# Patient Record
Sex: Male | Born: 1945 | Race: Black or African American | Hispanic: No | Marital: Married | Smoking: Former smoker
Health system: Southern US, Community
[De-identification: ages and names within clinical notes are randomized; demographics above are authoritative.]

## PROBLEM LIST (undated history)

## (undated) DIAGNOSIS — I639 Cerebral infarction, unspecified: Secondary | ICD-10-CM

## (undated) DIAGNOSIS — I10 Essential (primary) hypertension: Secondary | ICD-10-CM

## (undated) DIAGNOSIS — E7800 Pure hypercholesterolemia, unspecified: Secondary | ICD-10-CM

## (undated) DIAGNOSIS — K219 Gastro-esophageal reflux disease without esophagitis: Secondary | ICD-10-CM

## (undated) DIAGNOSIS — M1990 Unspecified osteoarthritis, unspecified site: Secondary | ICD-10-CM

## (undated) DIAGNOSIS — R1310 Dysphagia, unspecified: Secondary | ICD-10-CM

## (undated) HISTORY — PX: CIRCUMCISION: SUR203

---

## 2009-12-04 ENCOUNTER — Emergency Department (HOSPITAL_BASED_OUTPATIENT_CLINIC_OR_DEPARTMENT_OTHER): Admission: EM | Admit: 2009-12-04 | Discharge: 2009-12-04 | Payer: Self-pay | Admitting: Emergency Medicine

## 2009-12-06 ENCOUNTER — Emergency Department (HOSPITAL_BASED_OUTPATIENT_CLINIC_OR_DEPARTMENT_OTHER): Admission: EM | Admit: 2009-12-06 | Discharge: 2009-12-06 | Payer: Self-pay | Admitting: Emergency Medicine

## 2010-08-01 LAB — GLUCOSE, CAPILLARY: Glucose-Capillary: 129 mg/dL — ABNORMAL HIGH (ref 70–99)

## 2011-05-19 ENCOUNTER — Emergency Department (HOSPITAL_BASED_OUTPATIENT_CLINIC_OR_DEPARTMENT_OTHER)
Admission: EM | Admit: 2011-05-19 | Discharge: 2011-05-19 | Disposition: A | Discharge status: Home / Self Care | Payer: Medicare Other | Attending: Emergency Medicine | Admitting: Emergency Medicine

## 2011-05-19 ENCOUNTER — Encounter: Payer: Self-pay | Admitting: *Deleted

## 2011-05-19 DIAGNOSIS — E119 Type 2 diabetes mellitus without complications: Secondary | ICD-10-CM | POA: Insufficient documentation

## 2011-05-19 DIAGNOSIS — J329 Chronic sinusitis, unspecified: Secondary | ICD-10-CM | POA: Insufficient documentation

## 2011-05-19 DIAGNOSIS — I10 Essential (primary) hypertension: Secondary | ICD-10-CM | POA: Insufficient documentation

## 2011-05-19 DIAGNOSIS — J3489 Other specified disorders of nose and nasal sinuses: Secondary | ICD-10-CM | POA: Insufficient documentation

## 2011-05-19 DIAGNOSIS — Z79899 Other long term (current) drug therapy: Secondary | ICD-10-CM | POA: Insufficient documentation

## 2011-05-19 HISTORY — DX: Essential (primary) hypertension: I10

## 2011-05-19 LAB — GLUCOSE, CAPILLARY: Glucose-Capillary: 128 mg/dL — ABNORMAL HIGH (ref 70–99)

## 2011-05-19 MED ORDER — AZITHROMYCIN 250 MG PO TABS
500.0000 mg | ORAL_TABLET | Freq: Once | ORAL | Status: AC
  Administered 2011-05-19: 500 mg via ORAL
  Filled 2011-05-19: qty 2

## 2011-05-19 MED ORDER — IBUPROFEN 800 MG PO TABS
800.0000 mg | ORAL_TABLET | Freq: Once | ORAL | Status: AC
  Administered 2011-05-19: 800 mg via ORAL
  Filled 2011-05-19: qty 1

## 2011-05-19 MED ORDER — OXYMETAZOLINE HCL 0.05 % NA SOLN
1.0000 | Freq: Once | NASAL | Status: AC
  Administered 2011-05-19: 1 via NASAL
  Filled 2011-05-19: qty 15

## 2011-05-19 MED ORDER — AZITHROMYCIN 250 MG PO TABS: 250.0000 mg | ORAL_TABLET | Freq: Every day | ORAL | Status: AC

## 2011-05-19 NOTE — ED Provider Notes (Signed)
History     CSN: [id number]  Arrival date & time 05/19/11  1140   First MD Initiated Contact with Patient 05/19/11 1154      Chief Complaint  Patient presents with  . Nasal Congestion  . Sinusitis    (Consider location/radiation/quality/duration/timing/severity/associated sxs/prior treatment) HPI Patient is a 66 yo M who presents today for evaluation of his typical sinusitis symptoms.  He has forehead an maxillary pressure bilaterally that is an 8/10.  This is better with rest and worse with palpation and bending forward.  Patient has taken ibuprofen without relief.  He tried to go to his regular doctor but was told they could not see him.  They referred him to our facility to be seen instead.  The patient denies cough or other symptoms.  He is normally treated for this 2 times a year by his PCP with claritin and antibiotics.  He has also tried loratidine at home without success.  There are no other associated or modifying factors. Past Medical History  Diagnosis Date  . Diabetes mellitus   . Hypertension     History reviewed. No pertinent past surgical history.  History reviewed. No pertinent family history.  History  Substance Use Topics  . Smoking status: Never Smoker   . Smokeless tobacco: Never Used  . Alcohol Use: No      Review of Systems  Constitutional: Positive for fatigue.  HENT: Positive for congestion, sore throat, sneezing, postnasal drip and sinus pressure.   Eyes: Negative.   Respiratory: Negative.   Cardiovascular: Negative.   Gastrointestinal: Negative.   Genitourinary: Negative.   Musculoskeletal: Negative.   Skin: Negative.   Neurological: Positive for headaches.  Hematological: Negative.   Psychiatric/Behavioral: Negative.   All other systems reviewed and are negative.    Allergies  Review of patient's allergies indicates no known allergies.  Home Medications   Current Outpatient Rx  Name Route Sig Dispense Refill  . GLIPIZIDE 10 MG PO  TABS Oral Take 10 mg by mouth 2 (two) times daily before a meal.      . INSULIN ASPART 100 UNIT/ML [place] SOLN Subcutaneous Inject into the skin 3 (three) times daily before meals.      Marland Kitchen LISINOPRIL 10 MG PO TABS Oral Take 10 mg by mouth daily.      Marland Kitchen METFORMIN HCL 1000 MG PO TABS Oral Take 1,000 mg by mouth 2 (two) times daily with a meal.      . AZITHROMYCIN 250 MG PO TABS Oral Take 1 tablet (250 mg total) by mouth daily. 4 each 0    BP 137/87  Pulse 83  Temp(Src) 98.7 F (37.1 C) (Oral)  Resp 21  SpO2 97%  Physical Exam  Nursing note and vitals reviewed. Constitutional: He is oriented to person, place, and time. He appears well-developed and well-nourished. No distress.  HENT:  Head: Normocephalic and atraumatic.  Nose: Mucosal edema present. Right sinus exhibits maxillary sinus tenderness and frontal sinus tenderness. Left sinus exhibits maxillary sinus tenderness and frontal sinus tenderness.  Mouth/Throat: Uvula is midline and mucous membranes are normal. Posterior oropharyngeal erythema present. No oropharyngeal exudate or posterior oropharyngeal edema.  Eyes: Conjunctivae and EOM are normal. Pupils are equal, round, and reactive to light.  Neck: Normal range of motion.  Cardiovascular: Normal rate, regular rhythm, normal heart sounds and intact distal pulses.  Exam reveals no gallop and no friction rub.   No murmur heard. Pulmonary/Chest: Effort normal and breath sounds normal. No respiratory distress. He  has no wheezes. He has no rales.  Abdominal: Soft. Bowel sounds are normal. He exhibits no distension. There is no tenderness. There is no rebound and no guarding.  Musculoskeletal: Normal range of motion. He exhibits no edema.  Neurological: He is alert and oriented to person, place, and time. No cranial nerve deficit. He exhibits normal muscle tone. Coordination normal.  Skin: Skin is warm and dry. No rash noted.  Psychiatric: He has a normal mood and affect.    ED Course    Procedures (including critical care time)  Labs Reviewed  GLUCOSE, CAPILLARY - Abnormal; Notable for the following:    Glucose-Capillary 128 (*)    All other components within normal limits   No results found.   1. Sinusitis       MDM  Patient was evaluated by myself.  He was hemodynamically stable and presented with symptoms consistent with prior sinus infections.  He was given ibuprofen, his first dose of azithromycin, and Afrin.  His glucose was checked and was only 128.  He was discharged with remainder of 5 day course of antibiotics and Afrin.  He was advised only to use Afrin for 3 days.  Patient can follow-up with his regular doctor as needed.  Patient was discharged in good condition.        Cyndra Numbers, MD 05/19/11 [phone]

## 2011-05-19 NOTE — ED Notes (Signed)
2 day history of sinus pressure facial pain headache nasal congestion [facility] was told they were full today and to come here to be seen. Pt also has a cough states that he thinks from sinus drainage but is productive at times denies fever or chills

## 2011-05-19 NOTE — ED Notes (Signed)
cbg 128 

## 2011-10-18 ENCOUNTER — Inpatient Hospital Stay (HOSPITAL_BASED_OUTPATIENT_CLINIC_OR_DEPARTMENT_OTHER)
Admission: EM | Admit: 2011-10-18 | Discharge: 2011-10-20 | DRG: 066 | Disposition: A | Discharge status: Home / Self Care | Payer: Medicare Other | Source: Ambulatory Visit | Attending: Internal Medicine | Admitting: Internal Medicine

## 2011-10-18 ENCOUNTER — Encounter (HOSPITAL_BASED_OUTPATIENT_CLINIC_OR_DEPARTMENT_OTHER): Payer: Self-pay

## 2011-10-18 ENCOUNTER — Emergency Department (HOSPITAL_BASED_OUTPATIENT_CLINIC_OR_DEPARTMENT_OTHER): Payer: Medicare Other

## 2011-10-18 DIAGNOSIS — K219 Gastro-esophageal reflux disease without esophagitis: Secondary | ICD-10-CM | POA: Diagnosis present

## 2011-10-18 DIAGNOSIS — I639 Cerebral infarction, unspecified: Secondary | ICD-10-CM

## 2011-10-18 DIAGNOSIS — E119 Type 2 diabetes mellitus without complications: Secondary | ICD-10-CM | POA: Diagnosis present

## 2011-10-18 DIAGNOSIS — I10 Essential (primary) hypertension: Secondary | ICD-10-CM | POA: Diagnosis present

## 2011-10-18 DIAGNOSIS — I6340 Cerebral infarction due to embolism of unspecified cerebral artery: Secondary | ICD-10-CM

## 2011-10-18 DIAGNOSIS — Z794 Long term (current) use of insulin: Secondary | ICD-10-CM

## 2011-10-18 DIAGNOSIS — E1165 Type 2 diabetes mellitus with hyperglycemia: Secondary | ICD-10-CM

## 2011-10-18 DIAGNOSIS — IMO0001 Reserved for inherently not codable concepts without codable children: Secondary | ICD-10-CM

## 2011-10-18 DIAGNOSIS — R42 Dizziness and giddiness: Secondary | ICD-10-CM | POA: Diagnosis present

## 2011-10-18 DIAGNOSIS — I6350 Cerebral infarction due to unspecified occlusion or stenosis of unspecified cerebral artery: Principal | ICD-10-CM | POA: Diagnosis present

## 2011-10-18 DIAGNOSIS — D696 Thrombocytopenia, unspecified: Secondary | ICD-10-CM | POA: Diagnosis present

## 2011-10-18 DIAGNOSIS — Z79899 Other long term (current) drug therapy: Secondary | ICD-10-CM

## 2011-10-18 DIAGNOSIS — B372 Candidiasis of skin and nail: Secondary | ICD-10-CM | POA: Diagnosis not present

## 2011-10-18 HISTORY — DX: Gastro-esophageal reflux disease without esophagitis: K21.9

## 2011-10-18 LAB — DIFFERENTIAL
Basophils Absolute: 0 10*3/uL (ref 0.0–0.1)
Basophils Relative: 0 % (ref 0–1)
Eosinophils Absolute: 0.2 10*3/uL (ref 0.0–0.7)
Eosinophils Relative: 2 % (ref 0–5)
Lymphocytes Relative: 25 % (ref 12–46)
Lymphs Abs: 1.8 10*3/uL (ref 0.7–4.0)
Monocytes Absolute: 0.7 10*3/uL (ref 0.1–1.0)
Monocytes Relative: 10 % (ref 3–12)
Neutro Abs: 4.4 10*3/uL (ref 1.7–7.7)
Neutrophils Relative %: 62 % (ref 43–77)

## 2011-10-18 LAB — URINALYSIS, ROUTINE W REFLEX MICROSCOPIC
Bilirubin Urine: NEGATIVE
Glucose, UA: NEGATIVE mg/dL
Hgb urine dipstick: NEGATIVE
Ketones, ur: NEGATIVE mg/dL
Leukocytes, UA: NEGATIVE
Nitrite: NEGATIVE
Protein, ur: 100 mg/dL — AB
Specific Gravity, Urine: 1.02 (ref 1.005–1.030)
Urobilinogen, UA: 1 mg/dL (ref 0.0–1.0)
pH: 6 (ref 5.0–8.0)

## 2011-10-18 LAB — CBC
HCT: 37.2 % — ABNORMAL LOW (ref 39.0–52.0)
Hemoglobin: 13.3 g/dL (ref 13.0–17.0)
MCH: 30.4 pg (ref 26.0–34.0)
MCHC: 35.8 g/dL (ref 30.0–36.0)
MCV: 85.1 fL (ref 78.0–100.0)
Platelets: 148 10*3/uL — ABNORMAL LOW (ref 150–400)
RBC: 4.37 MIL/uL (ref 4.22–5.81)
RDW: 12.8 % (ref 11.5–15.5)
WBC: 7 10*3/uL (ref 4.0–10.5)

## 2011-10-18 LAB — URINE MICROSCOPIC-ADD ON

## 2011-10-18 LAB — BASIC METABOLIC PANEL
BUN: 13 mg/dL (ref 6–23)
CO2: 28 mEq/L (ref 19–32)
Calcium: 9.6 mg/dL (ref 8.4–10.5)
Chloride: 101 mEq/L (ref 96–112)
Creatinine, Ser: 1 mg/dL (ref 0.50–1.35)
GFR calc Af Amer: 88 mL/min — ABNORMAL LOW (ref >90)
GFR calc non Af Amer: 76 mL/min — ABNORMAL LOW (ref >90)
Glucose, Bld: 113 mg/dL — ABNORMAL HIGH (ref 70–99)
Potassium: 3.6 mEq/L (ref 3.5–5.1)
Sodium: 137 mEq/L (ref 135–145)

## 2011-10-18 LAB — GLUCOSE, CAPILLARY: Glucose-Capillary: 85 mg/dL (ref 70–99)

## 2011-10-18 LAB — TROPONIN I: Troponin I: <0.3 ng/mL (ref <0.30)

## 2011-10-18 MED ORDER — MORPHINE SULFATE 2 MG/ML IJ SOLN
2.0000 mg | Freq: Once | INTRAMUSCULAR | Status: AC
  Administered 2011-10-18: 2 mg via INTRAVENOUS
  Filled 2011-10-18: qty 1

## 2011-10-18 MED ORDER — ACETAMINOPHEN 325 MG PO TABS
650.0000 mg | ORAL_TABLET | Freq: Once | ORAL | Status: AC
  Administered 2011-10-18: 650 mg via ORAL
  Filled 2011-10-18: qty 2

## 2011-10-18 MED ORDER — MECLIZINE HCL 25 MG PO TABS
25.0000 mg | ORAL_TABLET | Freq: Once | ORAL | Status: AC
  Administered 2011-10-18: 25 mg via ORAL
  Filled 2011-10-18: qty 1

## 2011-10-18 NOTE — ED Provider Notes (Signed)
History     CSN: [id number]  Arrival date & time 10/18/11  1734   First MD Initiated Contact with Patient 10/18/11 1747      Chief Complaint  Patient presents with  . Hypertension    (Consider location/radiation/quality/duration/timing/severity/associated sxs/prior treatment) HPI Comments: Pt states that he has had bp elevation as high as 167101:pt states that he has intermittent episodes of dizziness where the room spins with change of positron:pt states that he thinks it is related to his pressure being high:pt states that he has been taking his bp medication  Patient is a 66 y.o. male presenting with hypertension. The history is provided by the patient. No language interpreter was used.  Hypertension This is a chronic problem. The current episode started 1 to 4 weeks ago. The problem occurs constantly. The problem has been unchanged. Associated symptoms include vertigo. Pertinent negatives include no chest pain, congestion, diaphoresis, fatigue or fever. Exacerbated by: changing positions. He has tried nothing for the symptoms.    Past Medical History  Diagnosis Date  . Diabetes mellitus   . Hypertension     History reviewed. No pertinent past surgical history.  No family history on file.  History  Substance Use Topics  . Smoking status: Never Smoker   . Smokeless tobacco: Never Used  . Alcohol Use: No      Review of Systems  Constitutional: Negative for fever, diaphoresis and fatigue.  HENT: Negative for congestion.   Respiratory: Negative.   Cardiovascular: Negative for chest pain.  Neurological: Positive for vertigo.    Allergies  Review of patient's allergies indicates no known allergies.  Home Medications   Current Outpatient Rx  Name Route Sig Dispense Refill  . GLIPIZIDE 10 MG PO TABS Oral Take 10 mg by mouth 2 (two) times daily before a meal.      . LISINOPRIL 10 MG PO TABS Oral Take 10 mg by mouth daily.      Marland Kitchen METFORMIN HCL 1000 MG PO TABS Oral  Take 1,000 mg by mouth 2 (two) times daily with a meal.      . OVER THE COUNTER MEDICATION Topical Apply 1 application topically daily as needed. Gold bond Cream. Patient uses this medication for rash on neck.    Marland Kitchen RANITIDINE HCL PO Oral Take 1 tablet by mouth daily. Patient uses this medication for acid reflux.      BP 157/90  Pulse 81  Temp(Src) 98.4 F (36.9 C) (Oral)  Resp 20  Ht 5\' 7"  (1.702 m)  Wt 180 lb (81.647 kg)  BMI 28.19 kg/m2  SpO2 98%  Physical Exam  Nursing note and vitals reviewed. Constitutional: He is oriented to person, place, and time. He appears well-developed and well-nourished.  HENT:  Head: Normocephalic and atraumatic.  Eyes: Conjunctivae and EOM are normal. Pupils are equal, round, and reactive to light.  Neck: Normal range of motion. Neck supple.  Cardiovascular: Normal rate and regular rhythm.   Pulmonary/Chest: Effort normal and breath sounds normal.  Abdominal: Soft. Bowel sounds are normal.  Musculoskeletal: Normal range of motion.  Neurological: He is alert and oriented to person, place, and time. He exhibits normal muscle tone. Coordination normal.       No drift noted:romberg negative:pt ambulates without problem  Skin:       Pt has scaly patches of skin noted to the back of the neck  Psychiatric: He has a normal mood and affect.    ED Course  Procedures (including critical care time)  Labs Reviewed  CBC - Abnormal; Notable for the following:    HCT 37.2 (*)    Platelets 148 (*)    All other components within normal limits  BASIC METABOLIC PANEL - Abnormal; Notable for the following:    Glucose, Bld 113 (*)    GFR calc non Af Amer 76 (*)    GFR calc Af Amer 88 (*)    All other components within normal limits  URINALYSIS, ROUTINE W REFLEX MICROSCOPIC - Abnormal; Notable for the following:    Protein, ur 100 (*)    All other components within normal limits  DIFFERENTIAL  URINE MICROSCOPIC-ADD ON  TROPONIN I    Date: 10/18/2011   Rate: 65  Rhythm: sinus arrhythmia  QRS Axis: normal  Intervals: normal  ST/T Wave abnormalities: normal  Conduction Disutrbances:none  Narrative Interpretation:   Old EKG Reviewed: unchanged   Dg Chest 2 View  10/18/2011  *RADIOLOGY REPORT*  Clinical Data: Hypertension with lightheadedness.  CHEST - 2 VIEW  Comparison: None.  Findings: The lungs are clear without focal infiltrate, edema, pneumothorax or pleural effusion. The cardiopericardial silhouette is within normal limits for size. Imaged bony structures of the thorax are intact. Telemetry leads overlie the chest.  IMPRESSION: No acute cardiopulmonary findings.  Original Report Authenticated By: ERIC A. MANSELL, M.D.   Ct Head Wo Contrast  10/18/2011  *RADIOLOGY REPORT*  Clinical Data: Dizziness with headache.  CT HEAD WITHOUT CONTRAST  Technique:  Contiguous axial images were obtained from the base of the skull through the vertex without contrast.  Comparison: None.  Findings: No evidence for acute hemorrhage, hydrocephalus, mass lesion, or abnormal extra-axial fluid collection.  There is an area of focally decreased attenuation in the anterior aspect of the left basal ganglia which could be an area of acute to subacute ischemia. Patchy low attenuation in the deep hemispheric and periventricular white matter is nonspecific, but likely reflects chronic microvascular ischemic demyelination.  The visualized paranasal sinuses and mastoid air cells are clear.  IMPRESSION: Focal area of asymmetrically decreased attenuation in the left basal ganglia may be an area of acute to subacute ischemia.  This could also be related to asymmetric chronic underlying chronic small vessel microvascular disease as demonstrated elsewhere.  MRI could be used to further evaluate as clinically warranted.  Original Report Authenticated By: ERIC A. MANSELL, M.D.     1. Stroke       MDM  Pt c/o of  Headache since being here:pt states that he has gotten some relief  with meclizine:pt is able to ambulate without assistance 10:30 PM Spoke with Dr. Roseanne Reno with neurology and he will see the pt in consult 10:43 PM Pt is admitted to hospitalist        Teressa Lower, NP 10/18/11 2244  Teressa Lower, NP 10/18/11 2245

## 2011-10-18 NOTE — ED Notes (Signed)
Patient transported to CT 

## 2011-10-18 NOTE — ED Notes (Signed)
[facility] en route to get pt. Pt eating crackers and sipping on water. Vitals stable.

## 2011-10-18 NOTE — ED Notes (Signed)
Did CBG on patient and result was: 71 Nurse notified of blood sugar.

## 2011-10-18 NOTE — ED Notes (Signed)
C/o elevated BP with light headness x 2-3 weeks-taking BP meds-unable to get appt with PCP-also c/o "2 bumps" to posterior neck-denies pain

## 2011-10-19 ENCOUNTER — Inpatient Hospital Stay (HOSPITAL_COMMUNITY): Payer: Medicare Other

## 2011-10-19 ENCOUNTER — Encounter (HOSPITAL_COMMUNITY): Payer: Self-pay | Admitting: *Deleted

## 2011-10-19 DIAGNOSIS — IMO0001 Reserved for inherently not codable concepts without codable children: Secondary | ICD-10-CM

## 2011-10-19 DIAGNOSIS — E1165 Type 2 diabetes mellitus with hyperglycemia: Secondary | ICD-10-CM

## 2011-10-19 DIAGNOSIS — K219 Gastro-esophageal reflux disease without esophagitis: Secondary | ICD-10-CM | POA: Diagnosis present

## 2011-10-19 DIAGNOSIS — E119 Type 2 diabetes mellitus without complications: Secondary | ICD-10-CM | POA: Diagnosis present

## 2011-10-19 DIAGNOSIS — I6340 Cerebral infarction due to embolism of unspecified cerebral artery: Secondary | ICD-10-CM

## 2011-10-19 DIAGNOSIS — I10 Essential (primary) hypertension: Secondary | ICD-10-CM | POA: Diagnosis present

## 2011-10-19 DIAGNOSIS — G459 Transient cerebral ischemic attack, unspecified: Secondary | ICD-10-CM

## 2011-10-19 DIAGNOSIS — I639 Cerebral infarction, unspecified: Secondary | ICD-10-CM

## 2011-10-19 LAB — LIPID PANEL
Cholesterol: 144 mg/dL (ref 0–200)
HDL: 40 mg/dL (ref >39)
LDL Cholesterol: 89 mg/dL (ref 0–99)
Total CHOL/HDL Ratio: 3.6 RATIO
Triglycerides: 76 mg/dL (ref <150)
VLDL: 15 mg/dL (ref 0–40)

## 2011-10-19 LAB — GLUCOSE, CAPILLARY
Glucose-Capillary: 110 mg/dL — ABNORMAL HIGH (ref 70–99)
Glucose-Capillary: 87 mg/dL (ref 70–99)
Glucose-Capillary: 58 mg/dL — ABNORMAL LOW (ref 70–99)
Glucose-Capillary: 176 mg/dL — ABNORMAL HIGH (ref 70–99)
Glucose-Capillary: 118 mg/dL — ABNORMAL HIGH (ref 70–99)
Glucose-Capillary: 104 mg/dL — ABNORMAL HIGH (ref 70–99)

## 2011-10-19 LAB — HEMOGLOBIN A1C
Hgb A1c MFr Bld: 5.9 % — ABNORMAL HIGH (ref <5.7)
Mean Plasma Glucose: 123 mg/dL — ABNORMAL HIGH (ref <117)

## 2011-10-19 MED ORDER — TRAMADOL HCL 50 MG PO TABS
50.0000 mg | ORAL_TABLET | Freq: Two times a day (BID) | ORAL | Status: DC | PRN
  Administered 2011-10-20: 50 mg via ORAL
  Filled 2011-10-19: qty 1

## 2011-10-19 MED ORDER — ASPIRIN 300 MG RE SUPP
300.0000 mg | Freq: Every day | RECTAL | Status: DC
  Filled 2011-10-19: qty 1

## 2011-10-19 MED ORDER — INSULIN GLARGINE 100 UNIT/ML ~~LOC~~ SOLN
10.0000 [IU] | Freq: Every day | SUBCUTANEOUS | Status: DC
  Administered 2011-10-19 (×2): 10 [IU] via SUBCUTANEOUS

## 2011-10-19 MED ORDER — SENNOSIDES-DOCUSATE SODIUM 8.6-50 MG PO TABS: 1.0000 | ORAL_TABLET | Freq: Every evening | ORAL | Status: DC | PRN

## 2011-10-19 MED ORDER — FAMOTIDINE 10 MG PO TABS
10.0000 mg | ORAL_TABLET | Freq: Every day | ORAL | Status: DC
  Administered 2011-10-19 – 2011-10-20 (×2): 10 mg via ORAL
  Filled 2011-10-19 (×2): qty 1

## 2011-10-19 MED ORDER — INSULIN ASPART 100 UNIT/ML ~~LOC~~ SOLN
0.0000 [IU] | Freq: Three times a day (TID) | SUBCUTANEOUS | Status: DC
  Administered 2011-10-20: 2 [IU] via SUBCUTANEOUS

## 2011-10-19 MED ORDER — ASPIRIN 325 MG PO TABS
325.0000 mg | ORAL_TABLET | Freq: Every day | ORAL | Status: DC
  Administered 2011-10-19: 325 mg via ORAL
  Filled 2011-10-19: qty 1

## 2011-10-19 MED ORDER — GLIPIZIDE 10 MG PO TABS
10.0000 mg | ORAL_TABLET | Freq: Two times a day (BID) | ORAL | Status: DC
  Administered 2011-10-19: 10 mg via ORAL
  Filled 2011-10-19 (×3): qty 1

## 2011-10-19 MED ORDER — ONDANSETRON HCL 4 MG/2ML IJ SOLN: 4.0000 mg | Freq: Four times a day (QID) | INTRAMUSCULAR | Status: DC | PRN

## 2011-10-19 MED ORDER — LISINOPRIL 10 MG PO TABS
10.0000 mg | ORAL_TABLET | Freq: Every day | ORAL | Status: DC
  Administered 2011-10-19: 10 mg via ORAL
  Filled 2011-10-19: qty 1

## 2011-10-19 MED ORDER — SIMVASTATIN 20 MG PO TABS
20.0000 mg | ORAL_TABLET | Freq: Every day | ORAL | Status: DC
  Administered 2011-10-19: 20 mg via ORAL
  Filled 2011-10-19 (×2): qty 1

## 2011-10-19 MED ORDER — SODIUM CHLORIDE 0.9 % IV SOLN
INTRAVENOUS | Status: DC
  Administered 2011-10-19 (×3): via INTRAVENOUS

## 2011-10-19 MED ORDER — ASPIRIN EC 81 MG PO TBEC
81.0000 mg | DELAYED_RELEASE_TABLET | Freq: Every day | ORAL | Status: DC
  Administered 2011-10-19 – 2011-10-20 (×2): 81 mg via ORAL
  Filled 2011-10-19 (×2): qty 1

## 2011-10-19 MED ORDER — METFORMIN HCL 500 MG PO TABS
1000.0000 mg | ORAL_TABLET | Freq: Two times a day (BID) | ORAL | Status: DC
  Administered 2011-10-19: 1000 mg via ORAL
  Filled 2011-10-19 (×3): qty 2

## 2011-10-19 MED ORDER — ACETAMINOPHEN 325 MG PO TABS
650.0000 mg | ORAL_TABLET | Freq: Four times a day (QID) | ORAL | Status: DC | PRN
  Administered 2011-10-19 (×2): 650 mg via ORAL
  Filled 2011-10-19 (×2): qty 2

## 2011-10-19 MED ORDER — OXYCODONE HCL 5 MG/5ML PO SOLN
5.0000 mg | Freq: Four times a day (QID) | ORAL | Status: DC | PRN
  Administered 2011-10-19 – 2011-10-20 (×3): 5 mg via ORAL
  Filled 2011-10-19 (×3): qty 5

## 2011-10-19 MED ORDER — ENOXAPARIN SODIUM 40 MG/0.4ML ~~LOC~~ SOLN
40.0000 mg | Freq: Every day | SUBCUTANEOUS | Status: DC
  Administered 2011-10-19 – 2011-10-20 (×2): 40 mg via SUBCUTANEOUS
  Filled 2011-10-19 (×2): qty 0.4

## 2011-10-19 NOTE — Evaluation (Signed)
Speech Language Pathology Evaluation Patient Details Name: Danen Lapaglia MRN: [id number] DOB: 01-Jul-1945 Today's Date: 10/19/2011 Time: 4098-1191 SLP Time Calculation (min): 17 min  Problem List:  Patient Active Problem List  Diagnoses  . Stroke  . DM (diabetes mellitus)  . HTN (hypertension)  . GERD (gastroesophageal reflux disease)   Past Medical History:  Past Medical History  Diagnosis Date  . Diabetes mellitus   . Hypertension   . GERD (gastroesophageal reflux disease)    Past Surgical History: History reviewed. No pertinent past surgical history. HPI:  This 66 year old male with history of diabetes and hypertension who is pretty much healthy working in his yard most of the time presented to the ED complaining of dizziness on and off since Saturday. His blood pressure has been going up and down in the last 3 weeks and he was worried about his symptoms continue with his blood pressure. In the ED he was evaluated and found to have a stroke; left basal ganglia.    Assessment / Plan / Recommendation Clinical Impression  Cognitive-linguistic skills appear WFL at this time. Patient soft spoken and with mildly delayed processing time however highly suspect this is secondary to reported significant lethargy. No f/u SLP needs indicated at this time. Please re-consult as needed.     SLP Assessment  Patient does not need any further Speech Lanaguage Pathology Services    Follow Up Recommendations  None       Pertinent Vitals/Pain n/a   SLP Goals  SLP Goals Progress/Goals/Alternative treatment plan discussed with pt/caregiver and they: Agree  SLP Evaluation Prior Functioning  Cognitive/Linguistic Baseline: Within functional limits Lives With: Spouse   Cognition  Overall Cognitive Status: Appears within functional limits for tasks assessed Orientation Level: Oriented X4    Comprehension  Auditory Comprehension Overall Auditory Comprehension: Appears within functional limits  for tasks assessed Visual Recognition/Discrimination Discrimination: Within Function Limits Reading Comprehension Reading Status: Within funtional limits (poor vision due to cataracts)    Expression Expression Primary Mode of Expression: Verbal Verbal Expression Overall Verbal Expression: Appears within functional limits for tasks assessed Written Expression Written Expression: Not tested   Oral / Motor Oral Motor/Sensory Function Overall Oral Motor/Sensory Function: Appears within functional limits for tasks assessed Motor Speech Overall Motor Speech: Appears within functional limits for tasks assessed    Ferdinand Lango MA, CCC-SLP [phone]  Bryauna Byrum Meryl 10/19/2011, 10:15 AM

## 2011-10-19 NOTE — ED Provider Notes (Signed)
Neuro:  + Romberg, CN 2-12 intact, no abnormal tone or coordination  Medical screening examination/treatment/procedure(s) were conducted as a shared visit with non-physician practitioner(s) and myself.  I personally evaluated the patient during the encounter  Cyndra Numbers, MD 10/19/11 [phone]

## 2011-10-19 NOTE — Progress Notes (Signed)
Aaron Blevins, OTR/L Pager: [phone] 10/19/2011

## 2011-10-19 NOTE — Progress Notes (Signed)
*  PRELIMINARY RESULTS* Echocardiogram 2D Echocardiogram has been performed.  Aaron Blevins 10/19/2011, 10:13 AM

## 2011-10-19 NOTE — Progress Notes (Signed)
VASCULAR LAB PRELIMINARY  PRELIMINARY  PRELIMINARY  PRELIMINARY  Carotid duplex completed.    Preliminary report:  Bilateral:  No evidence of hemodynamically significant internal carotid artery stenosis.   Vertebral artery flow is antegrade.     Liviana Mills D, RVS 10/19/2011, 4:24 PM

## 2011-10-19 NOTE — Consult Note (Signed)
[facility] CONSULT NOTE     Reason for Consult: possible stroke    HPI:   LSN 3 weeks ago--no tPA given - presented beyond time window, and no clear focal deficits.    Aaron Blevins is an 66 y.o. male who has been having "dizzy spells" for over 3 weeks now.  These can occur while sitting or standing.  He believes they become worse when he lays down versus standing up and does not describe any positional component.  When asked about rushing or tinnitus he states he does have a "ringing" in his ear from time to time but is not associated with his "dizzy sensation.  He also endorses some left arm weakness and bilateral leg weakness at times. He has noted his BP has been elevated over the past few weeks, most notably his diastolic BP.  At present time he is back to his baseline but does have a left temporal HA. CT scan in the emergency room showed findings indicating possible subacute basal ganglia stroke. Was no indication of basal ganglia stroke on MRI study. However MRI did show an area of abnormality on diffusion weighted image as well as ADC mapping indicating possible acute small left pontine infarction, versus artifact.  He was given Meclizine while in the hospital and states it helped a little.   Past Medical History  Diagnosis Date  . Diabetes mellitus   . Hypertension   . GERD (gastroesophageal reflux disease)     History reviewed. No pertinent past surgical history.  History reviewed. No pertinent family history.  Social History:  reports that he has never smoked. He has never used smokeless tobacco. He reports that he does not drink alcohol or use illicit drugs.  No Known Allergies  Medications:    Prior to Admission:  Prescriptions prior to admission  Medication Sig Dispense Refill  . glipiZIDE (GLUCOTROL) 10 MG tablet Take 10 mg by mouth 2 (two) times daily before a meal.        . insulin glargine (LANTUS) 100 UNIT/ML injection Inject 10  Units into the skin at bedtime.      Marland Kitchen lisinopril (PRINIVIL,ZESTRIL) 10 MG tablet Take 10 mg by mouth daily.        . metFORMIN (GLUCOPHAGE) 1000 MG tablet Take 1,000 mg by mouth 2 (two) times daily with a meal.        . OVER THE COUNTER MEDICATION Apply 1 application topically daily as needed. Gold bond Cream. Patient uses this medication for rash on neck.      Marland Kitchen RANITIDINE HCL PO Take 1 tablet by mouth daily. Patient uses this medication for acid reflux.       Scheduled:   . acetaminophen  650 mg Oral Once  . aspirin  300 mg Rectal Daily   Or  . aspirin  325 mg Oral Daily  . enoxaparin  40 mg Subcutaneous Daily  . famotidine  10 mg Oral Daily  . glipiZIDE  10 mg Oral BID AC  . insulin aspart  0-9 Units Subcutaneous TID WC  . insulin glargine  10 Units Subcutaneous QHS  . lisinopril  10 mg Oral Daily  . meclizine  25 mg Oral Once  . metFORMIN  1,000 mg Oral BID WC  .  morphine injection  2 mg Intravenous Once  . simvastatin  20 mg Oral q1800  Review of Systems - General ROS: negative for - chills, fatigue, fever or hot flashes Hematological and Lymphatic ROS: negative for - bruising, fatigue, jaundice or pallor Endocrine ROS: negative for - hair pattern changes, hot flashes, mood swings or skin changes Respiratory ROS: negative for - cough, hemoptysis, orthopnea or wheezing Cardiovascular ROS: negative for - dyspnea on exertion, orthopnea, palpitations or shortness of breath Gastrointestinal ROS: negative for - abdominal pain, appetite loss, blood in stools, diarrhea or hematemesis Musculoskeletal ROS: positive for - joint pain, joint stiffness, joint swelling or muscle pain Neurological ROS: positive for - dizziness, peripheral neuropathy and headaches Dermatological ROS: positive for dry skin, rash   Blood pressure 179/80, pulse 77, temperature 98.1 F (36.7 C), temperature source Oral, resp. rate 18, height 5\' 6"  (1.676 m), weight 81.7 kg (180 lb 1.9 oz), SpO2  98.00%.   Neurologic Examination:   Mental Status: Alert, oriented, thought content appropriate.  Speech fluent without evidence of aphasia. Able to follow 3 step commands without difficulty. Cranial Nerves: II-Visual fields grossly intact. III/IV/VI-Extraocular movements intact.  Pupils reactive bilaterally. V/VII-Smile symmetric VIII-grossly intact IX/X-normal gag XI-bilateral shoulder shrug XII-midline tongue extension Motor: 5/5 bilaterally with normal tone and bulk Sensory: Pinprick and light touch intact but vibratory sensation decreased in ankles and feet.   Deep Tendon Reflexes: Depressed throughout Plantars downgoing bilaterally Cerebellar: Normal finger-to-nose, normal rapid alternating movements and normal heel-to-shin test.     Lab Results  Component Value Date/Time   CHOL 144 10/19/2011  7:25 AM    Results for orders placed during the hospital encounter of 10/18/11 (from the past 48 hour(s))  CBC     Status: Abnormal   Collection Time   10/18/11  6:16 PM      Component Value Range Comment   WBC 7.0  4.0 - 10.5 (K/uL)    RBC 4.37  4.22 - 5.81 (MIL/uL)    Hemoglobin 13.3  13.0 - 17.0 (g/dL)    HCT 45.4 (*) 09.8 - 52.0 (%)    MCV 85.1  78.0 - 100.0 (fL)    MCH 30.4  26.0 - 34.0 (pg)    MCHC 35.8  30.0 - 36.0 (g/dL)    RDW 11.9  14.7 - 82.9 (%)    Platelets 148 (*) 150 - 400 (K/uL)   DIFFERENTIAL     Status: Normal   Collection Time   10/18/11  6:16 PM      Component Value Range Comment   Neutrophils Relative 62  43 - 77 (%)    Neutro Abs 4.4  1.7 - 7.7 (K/uL)    Lymphocytes Relative 25  12 - 46 (%)    Lymphs Abs 1.8  0.7 - 4.0 (K/uL)    Monocytes Relative 10  3 - 12 (%)    Monocytes Absolute 0.7  0.1 - 1.0 (K/uL)    Eosinophils Relative 2  0 - 5 (%)    Eosinophils Absolute 0.2  0.0 - 0.7 (K/uL)    Basophils Relative 0  0 - 1 (%)    Basophils Absolute 0.0  0.0 - 0.1 (K/uL)   BASIC METABOLIC PANEL     Status: Abnormal   Collection Time   10/18/11  6:16 PM       Component Value Range Comment   Sodium 137  135 - 145 (mEq/L)    Potassium 3.6  3.5 - 5.1 (mEq/L)    Chloride 101  96 - 112 (mEq/L)    CO2 28  19 - 32 (mEq/L)  Glucose, Bld 113 (*) 70 - 99 (mg/dL)    BUN 13  6 - 23 (mg/dL)    Creatinine, Ser 1.61  0.50 - 1.35 (mg/dL)    Calcium 9.6  8.4 - 10.5 (mg/dL)    GFR calc non Af Amer 76 (*) >90 (mL/min)    GFR calc Af Amer 88 (*) >90 (mL/min)   URINALYSIS, ROUTINE W REFLEX MICROSCOPIC     Status: Abnormal   Collection Time   10/18/11  6:16 PM      Component Value Range Comment   Color, Urine YELLOW  YELLOW     APPearance CLEAR  CLEAR     Specific Gravity, Urine 1.020  1.005 - 1.030     pH 6.0  5.0 - 8.0     Glucose, UA NEGATIVE  NEGATIVE (mg/dL)    Hgb urine dipstick NEGATIVE  NEGATIVE     Bilirubin Urine NEGATIVE  NEGATIVE     Ketones, ur NEGATIVE  NEGATIVE (mg/dL)    Protein, ur 096 (*) NEGATIVE (mg/dL)    Urobilinogen, UA 1.0  0.0 - 1.0 (mg/dL)    Nitrite NEGATIVE  NEGATIVE     Leukocytes, UA NEGATIVE  NEGATIVE    URINE MICROSCOPIC-ADD ON     Status: Normal   Collection Time   10/18/11  6:16 PM      Component Value Range Comment   Squamous Epithelial / LPF RARE  RARE     Bacteria, UA RARE  RARE    TROPONIN I     Status: Normal   Collection Time   10/18/11 10:03 PM      Component Value Range Comment   Troponin I <0.30  <0.30 (ng/mL)   GLUCOSE, CAPILLARY     Status: Normal   Collection Time   10/18/11 11:42 PM      Component Value Range Comment   Glucose-Capillary 85  70 - 99 (mg/dL)    Comment 1 Notify RN     GLUCOSE, CAPILLARY     Status: Abnormal   Collection Time   10/19/11  1:06 AM      Component Value Range Comment   Glucose-Capillary 110 (*) 70 - 99 (mg/dL)    Comment 1 Documented in Chart      Comment 2 Notify RN     GLUCOSE, CAPILLARY     Status: Normal   Collection Time   10/19/11  7:12 AM      Component Value Range Comment   Glucose-Capillary 87  70 - 99 (mg/dL)    Comment 1 Documented in Chart      Comment 2 Notify  RN     LIPID PANEL     Status: Normal   Collection Time   10/19/11  7:25 AM      Component Value Range Comment   Cholesterol 144  0 - 200 (mg/dL)    Triglycerides 76  <045 (mg/dL)    HDL 40  >40 (mg/dL)    Total CHOL/HDL Ratio 3.6      VLDL 15  0 - 40 (mg/dL)    LDL Cholesterol 89  0 - 99 (mg/dL)     Dg Chest 2 View  01/22/1190  *RADIOLOGY REPORT*  Clinical Data: Hypertension with lightheadedness.  CHEST - 2 VIEW  Comparison: None.  Findings: The lungs are clear without focal infiltrate, edema, pneumothorax or pleural effusion. The cardiopericardial silhouette is within normal limits for size. Imaged bony structures of the thorax are intact. Telemetry leads overlie the chest.  IMPRESSION: No acute cardiopulmonary findings.  Original Report Authenticated By: ERIC A. MANSELL, M.D.   Ct Head Wo Contrast  10/18/2011  *RADIOLOGY REPORT*  Clinical Data: Dizziness with headache.  CT HEAD WITHOUT CONTRAST  Technique:  Contiguous axial images were obtained from the base of the skull through the vertex without contrast.  Comparison: None.  Findings: No evidence for acute hemorrhage, hydrocephalus, mass lesion, or abnormal extra-axial fluid collection.  There is an area of focally decreased attenuation in the anterior aspect of the left basal ganglia which could be an area of acute to subacute ischemia. Patchy low attenuation in the deep hemispheric and periventricular white matter is nonspecific, but likely reflects chronic microvascular ischemic demyelination.  The visualized paranasal sinuses and mastoid air cells are clear.  IMPRESSION: Focal area of asymmetrically decreased attenuation in the left basal ganglia may be an area of acute to subacute ischemia.  This could also be related to asymmetric chronic underlying chronic small vessel microvascular disease as demonstrated elsewhere.  MRI could be used to further evaluate as clinically warranted.  Original Report Authenticated By: ERIC A. MANSELL, M.D.    MRI HEAD WITHOUT CONTRAST  MRA HEAD WITHOUT CONTRAST  Technique: Multiplanar, multiecho pulse sequences of the brain and  surrounding structures were obtained according to standard protocol  without intravenous contrast. Angiographic images of the head were  obtained using MRA technique without contrast.  Comparison: 10/18/2011 CT. No comparison MR.  MRI HEAD  Findings: Questionable tiny acute left pontine infarct (series 5  image 9) versus artifact. No other evidence of acute infarct  noted.  Moderate white matter type changes most consistent with result of  small vessel disease.  No intracranial hemorrhage.  No intracranial mass lesion detected on this unenhanced exam.  No hydrocephalus.  Mild paranasal sinus mucosal thickening.  IMPRESSION:  Questionable tiny acute left pontine infarct (series 5 image 9)  versus artifact. No other evidence of acute infarct noted.  Moderate white matter type changes most consistent with result of  small vessel disease.  Mild paranasal sinus mucosal thickening.  MRA HEAD  Findings: Anterior circulation without medium or large size vessel  significant stenosis or occlusion.  No significant stenosis of the vertebral arteries or basilar  artery.  Nonvisualization AICAs.  Mild branch vessel irregularity.  No aneurysm or vascular malformation noted.  IMPRESSION:  Mild branch vessel irregularity. Please see above.   Assessment/Plan:   66 YO male with 3 week history of feeling off balance, dizzy and room spinning from right to left intermittently. 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 It is conceivable that the patient could experienced a pontine stroke as etiology for his symptoms of disequilibrium.  He has significant risk factors for small vessel stroke.   Recommend: 1) Continue ASA daily, 2 D echo and Carotid doppler , as planned.  2) Cardiac Telemetry while in hospital 3) PT for vestibular rehab; may continue meclizine as  well.  4) Continued monitoring and control of hypertension  5) Diabetes management for optimal control.  HbA1C in process.   Stroke team to follow in AM.   Felicie Morn PA-C  [facility] [phone]  10/19/2011, 11:05 AM

## 2011-10-19 NOTE — H&P (Signed)
Aaron Blevins is an 66 y.o. male.   Chief Complaint: Dizziness HPI: This 66 year old male with history of diabetes and hypertension who is pretty much healthy working in his yard most of the time presented to the ED complaining of dizziness on and off since Saturday. His blood pressure has been going up and down in the last 3 weeks and he was worried about his symptoms continue with his blood pressure. In the ED he was evaluated and found to have a stroke. Patient has been having mainly dizziness but did not fall down. He wasn't using a walker but family noticed some point that he was almost going to fall down. He still feels dizzy especially when he gets up. Denies any headaches, focal weakness, no nausea vomiting or diarrhea, no recent or past history of strokes, has family history of strokes one of his daughters had TIAs and patient has risk factors for strokes including diabetes and hypertension.  Past Medical History  Diagnosis Date  . Diabetes mellitus   . Hypertension   . GERD (gastroesophageal reflux disease)     History reviewed. No pertinent past surgical history.  History reviewed. No pertinent family history. Social History:  reports that he has never smoked. He has never used smokeless tobacco. He reports that he does not drink alcohol or use illicit drugs.  Allergies: No Known Allergies  Medications Prior to Admission  Medication Sig Dispense Refill  . glipiZIDE (GLUCOTROL) 10 MG tablet Take 10 mg by mouth 2 (two) times daily before a meal.        . insulin glargine (LANTUS) 100 UNIT/ML injection Inject 10 Units into the skin at bedtime.      Marland Kitchen lisinopril (PRINIVIL,ZESTRIL) 10 MG tablet Take 10 mg by mouth daily.        . metFORMIN (GLUCOPHAGE) 1000 MG tablet Take 1,000 mg by mouth 2 (two) times daily with a meal.        . OVER THE COUNTER MEDICATION Apply 1 application topically daily as needed. Gold bond Cream. Patient uses this medication for rash on neck.      Marland Kitchen RANITIDINE  HCL PO Take 1 tablet by mouth daily. Patient uses this medication for acid reflux.        Results for orders placed during the hospital encounter of 10/18/11 (from the past 48 hour(s))  CBC     Status: Abnormal   Collection Time   10/18/11  6:16 PM      Component Value Range Comment   WBC 7.0  4.0 - 10.5 (K/uL)    RBC 4.37  4.22 - 5.81 (MIL/uL)    Hemoglobin 13.3  13.0 - 17.0 (g/dL)    HCT 16.1 (*) 09.6 - 52.0 (%)    MCV 85.1  78.0 - 100.0 (fL)    MCH 30.4  26.0 - 34.0 (pg)    MCHC 35.8  30.0 - 36.0 (g/dL)    RDW 04.5  40.9 - 81.1 (%)    Platelets 148 (*) 150 - 400 (K/uL)   DIFFERENTIAL     Status: Normal   Collection Time   10/18/11  6:16 PM      Component Value Range Comment   Neutrophils Relative 62  43 - 77 (%)    Neutro Abs 4.4  1.7 - 7.7 (K/uL)    Lymphocytes Relative 25  12 - 46 (%)    Lymphs Abs 1.8  0.7 - 4.0 (K/uL)    Monocytes Relative 10  3 - 12 (%)  Monocytes Absolute 0.7  0.1 - 1.0 (K/uL)    Eosinophils Relative 2  0 - 5 (%)    Eosinophils Absolute 0.2  0.0 - 0.7 (K/uL)    Basophils Relative 0  0 - 1 (%)    Basophils Absolute 0.0  0.0 - 0.1 (K/uL)   BASIC METABOLIC PANEL     Status: Abnormal   Collection Time   10/18/11  6:16 PM      Component Value Range Comment   Sodium 137  135 - 145 (mEq/L)    Potassium 3.6  3.5 - 5.1 (mEq/L)    Chloride 101  96 - 112 (mEq/L)    CO2 28  19 - 32 (mEq/L)    Glucose, Bld 113 (*) 70 - 99 (mg/dL)    BUN 13  6 - 23 (mg/dL)    Creatinine, Ser 1.61  0.50 - 1.35 (mg/dL)    Calcium 9.6  8.4 - 10.5 (mg/dL)    GFR calc non Af Amer 76 (*) >90 (mL/min)    GFR calc Af Amer 88 (*) >90 (mL/min)   URINALYSIS, ROUTINE W REFLEX MICROSCOPIC     Status: Abnormal   Collection Time   10/18/11  6:16 PM      Component Value Range Comment   Color, Urine YELLOW  YELLOW     APPearance CLEAR  CLEAR     Specific Gravity, Urine 1.020  1.005 - 1.030     pH 6.0  5.0 - 8.0     Glucose, UA NEGATIVE  NEGATIVE (mg/dL)    Hgb urine dipstick NEGATIVE   NEGATIVE     Bilirubin Urine NEGATIVE  NEGATIVE     Ketones, ur NEGATIVE  NEGATIVE (mg/dL)    Protein, ur 096 (*) NEGATIVE (mg/dL)    Urobilinogen, UA 1.0  0.0 - 1.0 (mg/dL)    Nitrite NEGATIVE  NEGATIVE     Leukocytes, UA NEGATIVE  NEGATIVE    URINE MICROSCOPIC-ADD ON     Status: Normal   Collection Time   10/18/11  6:16 PM      Component Value Range Comment   Squamous Epithelial / LPF RARE  RARE     Bacteria, UA RARE  RARE    TROPONIN I     Status: Normal   Collection Time   10/18/11 10:03 PM      Component Value Range Comment   Troponin I <0.30  <0.30 (ng/mL)   GLUCOSE, CAPILLARY     Status: Normal   Collection Time   10/18/11 11:42 PM      Component Value Range Comment   Glucose-Capillary 85  70 - 99 (mg/dL)    Comment 1 Notify RN     GLUCOSE, CAPILLARY     Status: Abnormal   Collection Time   10/19/11  1:06 AM      Component Value Range Comment   Glucose-Capillary 110 (*) 70 - 99 (mg/dL)    Comment 1 Documented in Chart      Comment 2 Notify RN      Dg Chest 2 View  10/18/2011  *RADIOLOGY REPORT*  Clinical Data: Hypertension with lightheadedness.  CHEST - 2 VIEW  Comparison: None.  Findings: The lungs are clear without focal infiltrate, edema, pneumothorax or pleural effusion. The cardiopericardial silhouette is within normal limits for size. Imaged bony structures of the thorax are intact. Telemetry leads overlie the chest.  IMPRESSION: No acute cardiopulmonary findings.  Original Report Authenticated By: ERIC A. MANSELL, M.D.   Ct Head  Wo Contrast  10/18/2011  *RADIOLOGY REPORT*  Clinical Data: Dizziness with headache.  CT HEAD WITHOUT CONTRAST  Technique:  Contiguous axial images were obtained from the base of the skull through the vertex without contrast.  Comparison: None.  Findings: No evidence for acute hemorrhage, hydrocephalus, mass lesion, or abnormal extra-axial fluid collection.  There is an area of focally decreased attenuation in the anterior aspect of the left basal  ganglia which could be an area of acute to subacute ischemia. Patchy low attenuation in the deep hemispheric and periventricular white matter is nonspecific, but likely reflects chronic microvascular ischemic demyelination.  The visualized paranasal sinuses and mastoid air cells are clear.  IMPRESSION: Focal area of asymmetrically decreased attenuation in the left basal ganglia may be an area of acute to subacute ischemia.  This could also be related to asymmetric chronic underlying chronic small vessel microvascular disease as demonstrated elsewhere.  MRI could be used to further evaluate as clinically warranted.  Original Report Authenticated By: ERIC A. MANSELL, M.D.    Review of Systems  Constitutional: Negative.   HENT: Negative.   Eyes: Negative.   Respiratory: Negative.   Cardiovascular: Negative.   Genitourinary: Negative.   Musculoskeletal: Negative.   Skin: Negative.   Neurological: Positive for dizziness. Negative for tingling, sensory change and speech change.  Psychiatric/Behavioral: Negative.   All other systems reviewed and are negative.    Blood pressure 162/87, pulse 64, temperature 98.7 F (37.1 C), temperature source Oral, resp. rate 16, height 5\' 6"  (1.676 m), weight 81.7 kg (180 lb 1.9 oz), SpO2 99.00%. Physical Exam  Constitutional: He is oriented to person, place, and time. He appears well-developed and well-nourished.  HENT:  Head: Normocephalic and atraumatic.  Right Ear: External ear normal.  Left Ear: External ear normal.  Nose: Nose normal.  Mouth/Throat: Oropharynx is clear and moist.  Eyes: Conjunctivae and EOM are normal. Pupils are equal, round, and reactive to light.  Neck: Normal range of motion. Neck supple.  Cardiovascular: Normal rate, regular rhythm, normal heart sounds and intact distal pulses.   Respiratory: Effort normal and breath sounds normal.  GI: Soft. Bowel sounds are normal.  Musculoskeletal: Normal range of motion.  Neurological: He  is alert and oriented to person, place, and time. He has normal reflexes. Coordination abnormal.  Skin: Skin is warm and dry.  Psychiatric: He has a normal mood and affect. His behavior is normal. Judgment and thought content normal.     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 Also diabetic hypertensive with some thrombocytopenia. Plan #1 stroke: Patient will be admitted for workup of acute stroke. We will check MRI/MRA of the brain, Carotid Doppler, We'll also check echocardiogram lipid panel. Patient will be started on aspirin as well as statins. Will get neurology consult in the morning for further follow up. His blood pressure in the 160s to 180s. Also get PT & OT to evaluate the patient and make recommendations #2 diabetes: We'll continue his home medications as well as sliding scale insulin. We'll check hemoglobin A1c. Check CBGs a.c. and each bedtime and adjust his medications as necessary #3 hypertension: In the setting of acute stroke we will repeat his blood pressure on the high side. #4 GERD: Continue with his ranitidine and consider adding PPIs if he has symptoms. #5 prophylaxis: We'll keep him on DVT prophylaxis using Lovenox.  Shaiann Mcmanamon,LAWAL 10/19/2011, 1:39 AM

## 2011-10-19 NOTE — Progress Notes (Addendum)
PT/OT Cancellation Note  PT/OT orders received and appreciated. Noted orders to start 6/5. Will defer eval till tomorrow.   [facility] [name] 10/19/2011, 11:02 AM Pager: [phone]

## 2011-10-19 NOTE — Progress Notes (Signed)
Patient's CBG checked at 1221 was 58, hypoglycemic protocol has been initiated , MD notified and waiting to check CBG again.

## 2011-10-19 NOTE — Progress Notes (Signed)
Subjective: Patient denies any new complaints.   Objective: Weight change:   Intake/Output Summary (Last 24 hours) at 10/19/11 1543 Last data filed at 10/19/11 0700  Gross per 24 hour  Intake 821.67 ml  Output    800 ml  Net  21.67 ml    Filed Vitals:   10/19/11 1427  BP: 117/71  Pulse: 66  Temp: 98.6 F (37 C)  Resp: 18   Physical Exam  He is alert, afebrile comfortable CVS S1S2 RRR, no murmers rubs or gallops Lungs CTAB, No wheezing, rhonchi or rales Abdomen : soft NT ND BS+ Extremities; no pedal edema.  Neuro: no motor or sensory deficits. Pt is alert and oriented to person and place, time.   Lab Results: Results for orders placed during the hospital encounter of 10/18/11 (from the past 24 hour(s))  CBC     Status: Abnormal   Collection Time   10/18/11  6:16 PM      Component Value Range   WBC 7.0  4.0 - 10.5 (K/uL)   RBC 4.37  4.22 - 5.81 (MIL/uL)   Hemoglobin 13.3  13.0 - 17.0 (g/dL)   HCT 16.1 (*) 09.6 - 52.0 (%)   MCV 85.1  78.0 - 100.0 (fL)   MCH 30.4  26.0 - 34.0 (pg)   MCHC 35.8  30.0 - 36.0 (g/dL)   RDW 04.5  40.9 - 81.1 (%)   Platelets 148 (*) 150 - 400 (K/uL)  DIFFERENTIAL     Status: Normal   Collection Time   10/18/11  6:16 PM      Component Value Range   Neutrophils Relative 62  43 - 77 (%)   Neutro Abs 4.4  1.7 - 7.7 (K/uL)   Lymphocytes Relative 25  12 - 46 (%)   Lymphs Abs 1.8  0.7 - 4.0 (K/uL)   Monocytes Relative 10  3 - 12 (%)   Monocytes Absolute 0.7  0.1 - 1.0 (K/uL)   Eosinophils Relative 2  0 - 5 (%)   Eosinophils Absolute 0.2  0.0 - 0.7 (K/uL)   Basophils Relative 0  0 - 1 (%)   Basophils Absolute 0.0  0.0 - 0.1 (K/uL)  BASIC METABOLIC PANEL     Status: Abnormal   Collection Time   10/18/11  6:16 PM      Component Value Range   Sodium 137  135 - 145 (mEq/L)   Potassium 3.6  3.5 - 5.1 (mEq/L)   Chloride 101  96 - 112 (mEq/L)   CO2 28  19 - 32 (mEq/L)   Glucose, Bld 113 (*) 70 - 99 (mg/dL)   BUN 13  6 - 23 (mg/dL)   Creatinine,  Ser 9.14  0.50 - 1.35 (mg/dL)   Calcium 9.6  8.4 - 78.2 (mg/dL)   GFR calc non Af Amer 76 (*) >90 (mL/min)   GFR calc Af Amer 88 (*) >90 (mL/min)  URINALYSIS, ROUTINE W REFLEX MICROSCOPIC     Status: Abnormal   Collection Time   10/18/11  6:16 PM      Component Value Range   Color, Urine YELLOW  YELLOW    APPearance CLEAR  CLEAR    Specific Gravity, Urine 1.020  1.005 - 1.030    pH 6.0  5.0 - 8.0    Glucose, UA NEGATIVE  NEGATIVE (mg/dL)   Hgb urine dipstick NEGATIVE  NEGATIVE    Bilirubin Urine NEGATIVE  NEGATIVE    Ketones, ur NEGATIVE  NEGATIVE (mg/dL)  Protein, ur 100 (*) NEGATIVE (mg/dL)   Urobilinogen, UA 1.0  0.0 - 1.0 (mg/dL)   Nitrite NEGATIVE  NEGATIVE    Leukocytes, UA NEGATIVE  NEGATIVE   URINE MICROSCOPIC-ADD ON     Status: Normal   Collection Time   10/18/11  6:16 PM      Component Value Range   Squamous Epithelial / LPF RARE  RARE    Bacteria, UA RARE  RARE   TROPONIN I     Status: Normal   Collection Time   10/18/11 10:03 PM      Component Value Range   Troponin I <0.30  <0.30 (ng/mL)  GLUCOSE, CAPILLARY     Status: Normal   Collection Time   10/18/11 11:42 PM      Component Value Range   Glucose-Capillary 85  70 - 99 (mg/dL)   Comment 1 Notify RN    GLUCOSE, CAPILLARY     Status: Abnormal   Collection Time   10/19/11  1:06 AM      Component Value Range   Glucose-Capillary 110 (*) 70 - 99 (mg/dL)   Comment 1 Documented in Chart     Comment 2 Notify RN    GLUCOSE, CAPILLARY     Status: Normal   Collection Time   10/19/11  7:12 AM      Component Value Range   Glucose-Capillary 87  70 - 99 (mg/dL)   Comment 1 Documented in Chart     Comment 2 Notify RN    HEMOGLOBIN A1C     Status: Abnormal   Collection Time   10/19/11  7:25 AM      Component Value Range   Hemoglobin A1C 5.9 (*) <5.7 (%)   Mean Plasma Glucose 123 (*) <117 (mg/dL)  LIPID PANEL     Status: Normal   Collection Time   10/19/11  7:25 AM      Component Value Range   Cholesterol 144  0 - 200  (mg/dL)   Triglycerides 76  <409 (mg/dL)   HDL 40  >81 (mg/dL)   Total CHOL/HDL Ratio 3.6     VLDL 15  0 - 40 (mg/dL)   LDL Cholesterol 89  0 - 99 (mg/dL)  GLUCOSE, CAPILLARY     Status: Abnormal   Collection Time   10/19/11 12:21 PM      Component Value Range   Glucose-Capillary 58 (*) 70 - 99 (mg/dL)  GLUCOSE, CAPILLARY     Status: Abnormal   Collection Time   10/19/11  1:24 PM      Component Value Range   Glucose-Capillary 176 (*) 70 - 99 (mg/dL)     Micro Results: No results found for this or any previous visit (from the past 240 hour(s)).  Studies/Results: Dg Chest 2 View  10/19/2011  *RADIOLOGY REPORT*  Clinical Data: Stroke  CHEST - 2 VIEW  Comparison: 10/18/2011  Findings: The heart size and mediastinal contours are within normal limits.  Both lungs are clear.  The visualized skeletal structures are unremarkable.  IMPRESSION: Negative examination.  Original Report Authenticated By: Rosealee Albee, M.D.   Dg Chest 2 View  10/18/2011  *RADIOLOGY REPORT*  Clinical Data: Hypertension with lightheadedness.  CHEST - 2 VIEW  Comparison: None.  Findings: The lungs are clear without focal infiltrate, edema, pneumothorax or pleural effusion. The cardiopericardial silhouette is within normal limits for size. Imaged bony structures of the thorax are intact. Telemetry leads overlie the chest.  IMPRESSION: No acute cardiopulmonary findings.  Original Report Authenticated By: ERIC A. MANSELL, M.D.   Ct Head Wo Contrast  10/18/2011  *RADIOLOGY REPORT*  Clinical Data: Dizziness with headache.  CT HEAD WITHOUT CONTRAST  Technique:  Contiguous axial images were obtained from the base of the skull through the vertex without contrast.  Comparison: None.  Findings: No evidence for acute hemorrhage, hydrocephalus, mass lesion, or abnormal extra-axial fluid collection.  There is an area of focally decreased attenuation in the anterior aspect of the left basal ganglia which could be an area of acute to  subacute ischemia. Patchy low attenuation in the deep hemispheric and periventricular white matter is nonspecific, but likely reflects chronic microvascular ischemic demyelination.  The visualized paranasal sinuses and mastoid air cells are clear.  IMPRESSION: Focal area of asymmetrically decreased attenuation in the left basal ganglia may be an area of acute to subacute ischemia.  This could also be related to asymmetric chronic underlying chronic small vessel microvascular disease as demonstrated elsewhere.  MRI could be used to further evaluate as clinically warranted.  Original Report Authenticated By: ERIC A. MANSELL, M.D.   Mr Brain Wo Contrast  10/19/2011  *RADIOLOGY REPORT*  Clinical Data:  .  Dizziness for the past 4 days.  Diabetic hypertensive.  MRI HEAD WITHOUT CONTRAST MRA HEAD WITHOUT CONTRAST  Technique: Multiplanar, multiecho pulse sequences of the brain and surrounding structures were obtained according to standard protocol without intravenous contrast.  Angiographic images of the head were obtained using MRA technique without contrast.  Comparison: 10/18/2011 CT.  No comparison MR.  MRI HEAD  Findings:  Questionable tiny acute left pontine infarct (series 5 image 9) versus artifact.  No other evidence of acute infarct noted.  Moderate white matter type changes most consistent with result of small vessel disease.  No intracranial hemorrhage.  No intracranial mass lesion detected on this unenhanced exam.  No hydrocephalus.  Mild paranasal sinus mucosal thickening.  IMPRESSION: Questionable tiny acute left pontine infarct (series 5 image 9) versus artifact.  No other evidence of acute infarct noted.  Moderate white matter type changes most consistent with result of small vessel disease.  Mild paranasal sinus mucosal thickening.  MRA HEAD  Findings: Anterior circulation without medium or large size vessel significant stenosis or occlusion.  No significant stenosis of the vertebral arteries or basilar  artery.  Nonvisualization AICAs.  Mild branch vessel irregularity.  No aneurysm or vascular malformation noted.  IMPRESSION:  Mild branch vessel irregularity.  Please see above.  Original Report Authenticated By: Fuller Canada, M.D.   Mr Mra Head/brain Wo Cm  10/19/2011  *RADIOLOGY REPORT*  Clinical Data:  .  Dizziness for the past 4 days.  Diabetic hypertensive.  MRI HEAD WITHOUT CONTRAST MRA HEAD WITHOUT CONTRAST  Technique: Multiplanar, multiecho pulse sequences of the brain and surrounding structures were obtained according to standard protocol without intravenous contrast.  Angiographic images of the head were obtained using MRA technique without contrast.  Comparison: 10/18/2011 CT.  No comparison MR.  MRI HEAD  Findings:  Questionable tiny acute left pontine infarct (series 5 image 9) versus artifact.  No other evidence of acute infarct noted.  Moderate white matter type changes most consistent with result of small vessel disease.  No intracranial hemorrhage.  No intracranial mass lesion detected on this unenhanced exam.  No hydrocephalus.  Mild paranasal sinus mucosal thickening.  IMPRESSION: Questionable tiny acute left pontine infarct (series 5 image 9) versus artifact.  No other evidence of acute infarct noted.  Moderate white matter  type changes most consistent with result of small vessel disease.  Mild paranasal sinus mucosal thickening.  MRA HEAD  Findings: Anterior circulation without medium or large size vessel significant stenosis or occlusion.  No significant stenosis of the vertebral arteries or basilar artery.  Nonvisualization AICAs.  Mild branch vessel irregularity.  No aneurysm or vascular malformation noted.  IMPRESSION:  Mild branch vessel irregularity.  Please see above.  Original Report Authenticated By: Fuller Canada, M.D.   Medications: Scheduled Meds:   . acetaminophen  650 mg Oral Once  . aspirin EC  81 mg Oral Daily  . enoxaparin  40 mg Subcutaneous Daily  . famotidine   10 mg Oral Daily  . glipiZIDE  10 mg Oral BID AC  . insulin aspart  0-9 Units Subcutaneous TID WC  . insulin glargine  10 Units Subcutaneous QHS  . lisinopril  10 mg Oral Daily  . meclizine  25 mg Oral Once  . metFORMIN  1,000 mg Oral BID WC  .  morphine injection  2 mg Intravenous Once  . simvastatin  20 mg Oral q1800  . DISCONTD: aspirin  300 mg Rectal Daily  . DISCONTD: aspirin  325 mg Oral Daily   Continuous Infusions:   . sodium chloride 100 mL/hr at 10/19/11 1307   PRN Meds:.acetaminophen, ondansetron (ZOFRAN) IV, senna-docusate  Assessment/Plan: Patient Active Hospital Problem List: Stroke (10/19/2011) CT HEAD abnormal, followed by MRI of the brain showing questionable tiny acute left pontine infarct.  Echo pending, Carotid duplex pending.  hgba1c is  5.9. LDL is 89.  Patient is currently on Aspirin 81mg , and zocor.  Neuro on board.  DM (diabetes mellitus) (10/19/2011)    CBG (last 3)   Basename 10/19/11 1324 10/19/11 1221 10/19/11 0712  GLUCAP 176* 58* 87    Will discontinue glipizide, for low blood sugars.  hgba1c is 5.9%  HTN (hypertension) (10/19/2011)  better controlled.   GERD (gastroesophageal reflux disease) (10/19/2011) Stable.   DVT prophylaxis; Lovenox.    LOS: 1 day   Clancy Mullarkey 10/19/2011, 3:43 PM

## 2011-10-20 DIAGNOSIS — IMO0001 Reserved for inherently not codable concepts without codable children: Secondary | ICD-10-CM

## 2011-10-20 DIAGNOSIS — I6340 Cerebral infarction due to embolism of unspecified cerebral artery: Secondary | ICD-10-CM

## 2011-10-20 DIAGNOSIS — I10 Essential (primary) hypertension: Secondary | ICD-10-CM

## 2011-10-20 DIAGNOSIS — K219 Gastro-esophageal reflux disease without esophagitis: Secondary | ICD-10-CM

## 2011-10-20 DIAGNOSIS — E1165 Type 2 diabetes mellitus with hyperglycemia: Secondary | ICD-10-CM

## 2011-10-20 DIAGNOSIS — G459 Transient cerebral ischemic attack, unspecified: Secondary | ICD-10-CM

## 2011-10-20 LAB — BASIC METABOLIC PANEL
BUN: 9 mg/dL (ref 6–23)
CO2: 26 mEq/L (ref 19–32)
Calcium: 8.8 mg/dL (ref 8.4–10.5)
Chloride: 106 mEq/L (ref 96–112)
Creatinine, Ser: 0.94 mg/dL (ref 0.50–1.35)
GFR calc Af Amer: >90 mL/min (ref >90)
GFR calc non Af Amer: 85 mL/min — ABNORMAL LOW (ref >90)
Glucose, Bld: 91 mg/dL (ref 70–99)
Potassium: 3.6 mEq/L (ref 3.5–5.1)
Sodium: 139 mEq/L (ref 135–145)

## 2011-10-20 LAB — GLUCOSE, CAPILLARY
Glucose-Capillary: 103 mg/dL — ABNORMAL HIGH (ref 70–99)
Glucose-Capillary: 172 mg/dL — ABNORMAL HIGH (ref 70–99)

## 2011-10-20 LAB — CBC
HCT: 35.1 % — ABNORMAL LOW (ref 39.0–52.0)
Hemoglobin: 12 g/dL — ABNORMAL LOW (ref 13.0–17.0)
MCH: 29.8 pg (ref 26.0–34.0)
MCHC: 34.2 g/dL (ref 30.0–36.0)
MCV: 87.1 fL (ref 78.0–100.0)
Platelets: 147 10*3/uL — ABNORMAL LOW (ref 150–400)
RBC: 4.03 MIL/uL — ABNORMAL LOW (ref 4.22–5.81)
RDW: 13.4 % (ref 11.5–15.5)
WBC: 4.8 10*3/uL (ref 4.0–10.5)

## 2011-10-20 MED ORDER — TRAMADOL HCL 50 MG PO TABS: 50.0000 mg | ORAL_TABLET | Freq: Two times a day (BID) | ORAL | Status: AC | PRN

## 2011-10-20 MED ORDER — KETOCONAZOLE 2 % EX CREA
TOPICAL_CREAM | Freq: Two times a day (BID) | CUTANEOUS | Status: DC
  Filled 2011-10-20: qty 15

## 2011-10-20 MED ORDER — KETOCONAZOLE 2 % EX CREA: TOPICAL_CREAM | Freq: Two times a day (BID) | CUTANEOUS | Status: DC

## 2011-10-20 MED ORDER — PRAVASTATIN SODIUM 40 MG PO TABS: 40.0000 mg | ORAL_TABLET | Freq: Every day | ORAL | Status: DC

## 2011-10-20 MED ORDER — ASPIRIN 81 MG PO TBEC: 81.0000 mg | DELAYED_RELEASE_TABLET | Freq: Every day | ORAL | Status: DC

## 2011-10-20 MED ORDER — MECLIZINE HCL 12.5 MG PO TABS: 12.5000 mg | ORAL_TABLET | Freq: Three times a day (TID) | ORAL | Status: AC | PRN

## 2011-10-20 NOTE — Discharge Summary (Signed)
Physician Discharge Summary  Patient ID: Aaron Blevins MRN: [id number] DOB/AGE: Mar 01, 1946 66 y.o.  Admit date: 10/18/2011 Discharge date: 10/20/2011  Primary Care Physician:  No primary provider on file.   Discharge Diagnoses:   1-Dizziness (due to acute pontine stroke vs Vertigo) 2-DM (diabetes mellitus) 3-HTN (hypertension) 4-HLD 5-GERD (gastroesophageal reflux disease) 6-Skin candidiasis   Medication List  As of 10/20/2011 12:36 PM   STOP taking these medications         OVER THE COUNTER MEDICATION         TAKE these medications         aspirin 81 MG EC tablet   Take 1 tablet (81 mg total) by mouth daily.      glipiZIDE 10 MG tablet   Commonly known as: GLUCOTROL   Take 10 mg by mouth 2 (two) times daily before a meal.      insulin glargine 100 UNIT/ML injection   Commonly known as: LANTUS   Inject 10 Units into the skin at bedtime.      ketoconazole 2 % cream   Commonly known as: NIZORAL   Apply topically 2 (two) times daily. Apply to neck area; keep skin clean and dry.      lisinopril 10 MG tablet   Commonly known as: PRINIVIL,ZESTRIL   Take 10 mg by mouth daily.      meclizine 12.5 MG tablet   Commonly known as: ANTIVERT   Take 1 tablet (12.5 mg total) by mouth 3 (three) times daily as needed.      metFORMIN 1000 MG tablet   Commonly known as: GLUCOPHAGE   Take 1,000 mg by mouth 2 (two) times daily with a meal.      pravastatin 40 MG tablet   Commonly known as: PRAVACHOL   Take 1 tablet (40 mg total) by mouth daily.      RANITIDINE HCL PO   Take 1 tablet by mouth daily. Patient uses this medication for acid reflux.      traMADol 50 MG tablet   Commonly known as: ULTRAM   Take 1 tablet (50 mg total) by mouth every 12 (twelve) hours as needed.             Disposition and Follow-up: Patient discharge in stable and improved condition. At this point plan is for risk factors modifications and preventive therapy using ASA daily. Patient was evaluated  by Pt and at this point no further discharge needs are anticipated. He will also use PRN meclizine for dizziness. Patient will follow with PCP in 2 weeks.  Consults:   Neurology   Significant Diagnostic Studies:  Dg Chest 2 View  10/19/2011  *RADIOLOGY REPORT*  Clinical Data: Stroke  CHEST - 2 VIEW  Comparison: 10/18/2011  Findings: The heart size and mediastinal contours are within normal limits.  Both lungs are clear.  The visualized skeletal structures are unremarkable.  IMPRESSION: Negative examination.  Original Report Authenticated By: Rosealee Albee, M.D.   Dg Chest 2 View  10/18/2011  *RADIOLOGY REPORT*  Clinical Data: Hypertension with lightheadedness.  CHEST - 2 VIEW  Comparison: None.  Findings: The lungs are clear without focal infiltrate, edema, pneumothorax or pleural effusion. The cardiopericardial silhouette is within normal limits for size. Imaged bony structures of the thorax are intact. Telemetry leads overlie the chest.  IMPRESSION: No acute cardiopulmonary findings.  Original Report Authenticated By: ERIC A. MANSELL, M.D.   Ct Head Wo Contrast  10/18/2011  *RADIOLOGY REPORT*  Clinical Data: Dizziness with  headache.  CT HEAD WITHOUT CONTRAST  Technique:  Contiguous axial images were obtained from the base of the skull through the vertex without contrast.  Comparison: None.  Findings: No evidence for acute hemorrhage, hydrocephalus, mass lesion, or abnormal extra-axial fluid collection.  There is an area of focally decreased attenuation in the anterior aspect of the left basal ganglia which could be an area of acute to subacute ischemia. Patchy low attenuation in the deep hemispheric and periventricular white matter is nonspecific, but likely reflects chronic microvascular ischemic demyelination.  The visualized paranasal sinuses and mastoid air cells are clear.  IMPRESSION: Focal area of asymmetrically decreased attenuation in the left basal ganglia may be an area of acute to  subacute ischemia.  This could also be related to asymmetric chronic underlying chronic small vessel microvascular disease as demonstrated elsewhere.  MRI could be used to further evaluate as clinically warranted.  Original Report Authenticated By: ERIC A. MANSELL, M.D.   Mr Brain Wo Contrast  10/19/2011  *RADIOLOGY REPORT*  Clinical Data:  .  Dizziness for the past 4 days.  Diabetic hypertensive.  MRI HEAD WITHOUT CONTRAST MRA HEAD WITHOUT CONTRAST  Technique: Multiplanar, multiecho pulse sequences of the brain and surrounding structures were obtained according to standard protocol without intravenous contrast.  Angiographic images of the head were obtained using MRA technique without contrast.  Comparison: 10/18/2011 CT.  No comparison MR.  MRI HEAD  Findings:  Questionable tiny acute left pontine infarct (series 5 image 9) versus artifact.  No other evidence of acute infarct noted.  Moderate white matter type changes most consistent with result of small vessel disease.  No intracranial hemorrhage.  No intracranial mass lesion detected on this unenhanced exam.  No hydrocephalus.  Mild paranasal sinus mucosal thickening.  IMPRESSION: Questionable tiny acute left pontine infarct (series 5 image 9) versus artifact.  No other evidence of acute infarct noted.  Moderate white matter type changes most consistent with result of small vessel disease.  Mild paranasal sinus mucosal thickening.  MRA HEAD  Findings: Anterior circulation without medium or large size vessel significant stenosis or occlusion.  No significant stenosis of the vertebral arteries or basilar artery.  Nonvisualization AICAs.  Mild branch vessel irregularity.  No aneurysm or vascular malformation noted.  IMPRESSION:  Mild branch vessel irregularity.  Please see above.  Original Report Authenticated By: Fuller Canada, M.D.   Mr Mra Head/brain Wo Cm  10/19/2011  *RADIOLOGY REPORT*  Clinical Data:  .  Dizziness for the past 4 days.  Diabetic  hypertensive.  MRI HEAD WITHOUT CONTRAST MRA HEAD WITHOUT CONTRAST  Technique: Multiplanar, multiecho pulse sequences of the brain and surrounding structures were obtained according to standard protocol without intravenous contrast.  Angiographic images of the head were obtained using MRA technique without contrast.  Comparison: 10/18/2011 CT.  No comparison MR.  MRI HEAD  Findings:  Questionable tiny acute left pontine infarct (series 5 image 9) versus artifact.  No other evidence of acute infarct noted.  Moderate white matter type changes most consistent with result of small vessel disease.  No intracranial hemorrhage.  No intracranial mass lesion detected on this unenhanced exam.  No hydrocephalus.  Mild paranasal sinus mucosal thickening.  IMPRESSION: Questionable tiny acute left pontine infarct (series 5 image 9) versus artifact.  No other evidence of acute infarct noted.  Moderate white matter type changes most consistent with result of small vessel disease.  Mild paranasal sinus mucosal thickening.  MRA HEAD  Findings: Anterior circulation without  medium or large size vessel significant stenosis or occlusion.  No significant stenosis of the vertebral arteries or basilar artery.  Nonvisualization AICAs.  Mild branch vessel irregularity.  No aneurysm or vascular malformation noted.  IMPRESSION:  Mild branch vessel irregularity.  Please see above.  Original Report Authenticated By: Fuller Canada, M.D.    Brief H and P: For complete details please refer to admission H and P, but in brief 66 year old male with history of diabetes, HTN, HLD and GERD; who presented to the ED complaining of dizziness on and off for the last 2 weeks. His blood pressure has been going up and down in the last 3 weeks as well and he was worried about continuation of his symptoms with his uncontrolled blood pressure. In the ED he had a CT scan/MRI of the brain with findings suggesting a stroke. He was admitted for further evaluation  and treatment.   Hospital Course:  1-Dizziness (due to acute pontine stroke vs Vertigo): at this point patient has been started on ASA daily for secondary prevention and will follow with Dr. Pearlean Brownie in 4-6 weeks. Will also continue secondary risk factors modifications. Patient work with PT as inpatient and no further needs anticipated at discharge; meclizine PRN prescribed for dizziness.  2-DM (diabetes mellitus): continue metformin and glipizide. Further treatment and medication adjustment per PCP.  3-HTN (hypertension):stable. Continue current medications and low sodium diet.  4-HLD: continue statins.  5-GERD (gastroesophageal reflux disease): continue ranitidine.  6-Skin candidiasis: started on ketoconazole cream BID. Patient advised to keep area cleana nd dry and will follow with PCP in 2 weeks.   Time spent on Discharge: 40 minutes  Signed: Edwardo Wojnarowski 10/20/2011, 12:29 PM

## 2011-10-20 NOTE — Discharge Instructions (Signed)
STROKE/TIA DISCHARGE INSTRUCTIONS SMOKING Cigarette smoking nearly doubles your risk of having a stroke & is the single most alterable risk factor  If you smoke or have smoked in the last 12 months, you are advised to quit smoking for your health.  Most of the excess cardiovascular risk related to smoking disappears within a year of stopping.  Ask you doctor about anti-smoking medications  [place] Quit Line: 1-800-QUIT NOW  Free Smoking Cessation Classes ([phone]  CHOLESTEROL Know your levels; limit fat & cholesterol in your diet  Lipid Panel  No results found for this basename: chol, trig, hdl, cholhdl, vldl, ldlcalc      Many patients benefit from treatment even if their cholesterol is at goal.  Goal: Total Cholesterol (CHOL) less than 160  Goal:  Triglycerides (TRIG) less than 150  Goal:  HDL greater than 40  Goal:  LDL (LDLCALC) less than 100   BLOOD PRESSURE American Stroke Association blood pressure target is less that 120/80 mm/Hg  Your discharge blood pressure is:  BP: 115/62 mmHg  Monitor your blood pressure  Limit your salt and alcohol intake  Many individuals will require more than one medication for high blood pressure  DIABETES (A1c is a blood sugar average for last 3 months) Goal HGBA1c is under 7% (HBGA1c is blood sugar average for last 3 months)  Diabetes: {STROKE DC DIABETES:22357}    No results found for this basename: HGBA1C     Your HGBA1c can be lowered with medications, healthy diet, and exercise.  Check your blood sugar as directed by your physician  Call your physician if you experience unexplained or low blood sugars.  PHYSICAL ACTIVITY/REHABILITATION Goal is 30 minutes at least 4 days per week    {STROKE DC ACTIVITY/REHAB:22359}  Activity decreases your risk of heart attack and stroke and makes your heart stronger.  It helps control your weight and blood pressure; helps you relax and can improve your mood.  Participate in a regular exercise  program.  Talk with your doctor about the best form of exercise for you (dancing, walking, swimming, cycling).  DIET/WEIGHT Goal is to maintain a healthy weight  Your discharge diet is: Carb Control *** liquids Your height is:  Height: 5\' 6"  (167.6 cm) Your current weight is: Weight: 81.7 kg (180 lb 1.9 oz) Your Body Mass Index (BMI) is:  BMI (Calculated): 29.1   Following the type of diet specifically designed for you will help prevent another stroke.  Your goal weight range is:  ***  Your goal Body Mass Index (BMI) is 19-24.  Healthy food habits can help reduce 3 risk factors for stroke:  High cholesterol, hypertension, and excess weight.  RESOURCES Stroke/Support Group:  Call [phone]  they meet the 3rd Sunday of the month on the Rehab Unit at [facility], [place] ( no meetings June, July & Aug).  STROKE EDUCATION PROVIDED/REVIEWED AND GIVEN TO PATIENT Stroke warning signs and symptoms How to activate emergency medical system (call 911). Medications prescribed at discharge. Need for follow-up after discharge. Personal risk factors for stroke. Pneumonia vaccine given:   {STROKE DC YES/NO/DATE:22363} Flu vaccine given:   {STROKE DC YES/NO/DATE:22363} My questions have been answered, the writing is legible, and I understand these instructions.  I will adhere to these goals & educational materials that have been provided to me after my discharge from the hospital.

## 2011-10-20 NOTE — Progress Notes (Signed)
History: Aaron Blevins is an 66 y.o. male who has been having "dizzy spells" for over 3 weeks now. These can occur while sitting or standing. He believes they become worse when he lays down versus standing up and does not describe any positional component. When asked about rushing or tinnitus he states he does have a "ringing" in his ear from time to time but is not associated with his "dizzy sensation. He also endorses some left arm weakness and bilateral leg weakness at times. He has noted his BP has been elevated over the past few weeks, most notably his diastolic BP. CT scan in the emergency room showed findings indicating possible subacute basal ganglia stroke. Was no indication of basal ganglia stroke on MRI study. However MRI did show an area of abnormality on diffusion weighted image as well as ADC mapping indicating possible acute small left pontine infarction, versus artifact.    Subjective: Sleepy this am, but ok.  Dizziness may be better?  Hasn't worked with PT yet.  Objective: BP 147/85  Pulse 57  Temp(Src) 98.3 F (36.8 C) (Oral)  Resp 18  Ht 5\' 6"  (1.676 m)  Wt 81.7 kg (180 lb 1.9 oz)  BMI 29.07 kg/m2  SpO2 99% Telemetry:  CBGs  Basename 10/20/11 0717 10/19/11 2210 10/19/11 1619 10/19/11 1324 10/19/11 1221 10/19/11 0712 10/19/11 0106 10/18/11 2342  GLUCAP 103* 104* 118* 176* 58* 87 110* 85    Diet: CHO mod  Activity: up with assist  DVT Prophylaxis: lovenox   Medications: Scheduled:   . aspirin EC  81 mg Oral Daily  . enoxaparin  40 mg Subcutaneous Daily  . famotidine  10 mg Oral Daily  . insulin aspart  0-9 Units Subcutaneous TID WC  . insulin glargine  10 Units Subcutaneous QHS  . simvastatin  20 mg Oral q1800  . DISCONTD: aspirin  300 mg Rectal Daily  . DISCONTD: aspirin  325 mg Oral Daily  . DISCONTD: glipiZIDE  10 mg Oral BID AC  . DISCONTD: lisinopril  10 mg Oral Daily  . DISCONTD: metFORMIN  1,000 mg Oral BID WC    Neurologic Exam: Mental  Status: Alert, oriented, thought content appropriate.  Speech fluent without evidence of aphasia. Able to follow 3 step commands without difficulty. Cranial Nerves: II- Visual fields grossly intact. III/IV/VI-Extraocular movements intact. No nystagmus.  V/VII-Smile symmetric VIII-hearing grossly intact XII-midline tongue extension Motor: 5/5 bilaterally with normal tone and bulk.  No drift. Sensory: Light touch intact throughout, slight diminished sensation left arm compared to right. Deep Tendon Reflexes: 2+ and symmetric throughout Plantars: Downgoing bilaterally Cerebellar: Normal finger-to-nose, normal rapid alternating movements.   Lab Results: Basic Metabolic Panel:  Lab 10/20/11 9811 10/18/11 1816  NA 139 137  K 3.6 3.6  CL 106 101  CO2 26 28  GLUCOSE 91 113*  BUN 9 13  CREATININE 0.94 1.00  CALCIUM 8.8 9.6  MG -- --  PHOS -- --   CBC:  Lab 10/20/11 0500 10/18/11 1816  WBC 4.8 7.0  NEUTROABS -- 4.4  HGB 12.0* 13.3  HCT 35.1* 37.2*  MCV 87.1 85.1  PLT 147* 148*   Cardiac Enzymes:  Lab 10/18/11 2203  CKTOTAL --  CKMB --  CKMBINDEX --  TROPONINI <0.30   CBG:  Lab 10/20/11 0717 10/19/11 2210 10/19/11 1619 10/19/11 1324 10/19/11 1221 10/19/11 0712  GLUCAP 103* 104* 118* 176* 58* 87   Hemoglobin A1C:  Lab 10/19/11 0725  HGBA1C 5.9*   Fasting Lipid Panel:  Lab 10/19/11 0725  CHOL 144  HDL 40  LDLCALC 89  TRIG 76  CHOLHDL 3.6  LDLDIRECT --   Urinalysis:  Lab 10/18/11 1816  COLORURINE YELLOW  LABSPEC 1.020  PHURINE 6.0  GLUCOSEU NEGATIVE  HGBUR NEGATIVE  BILIRUBINUR NEGATIVE  KETONESUR NEGATIVE  PROTEINUR 100*  UROBILINOGEN 1.0  NITRITE NEGATIVE  LEUKOCYTESUR NEGATIVE   Study Results:  10/19/2011 CHEST - 2 VIEW  Comparison: 10/18/2011  Findings: The heart size and mediastinal contours are within normal limits.  Both lungs are clear.  The visualized skeletal structures are unremarkable.  IMPRESSION: Negative examination. Rosealee Albee,  M.D.   10/18/2011  CHEST - 2 VIEW  Findings: The lungs are clear without focal infiltrate, edema, pneumothorax or pleural effusion. The cardiopericardial silhouette is within normal limits for size. Imaged bony structures of the thorax are intact. Telemetry leads overlie the chest.  IMPRESSION: No acute cardiopulmonary findings.  ERIC A. MANSELL, M.D.   10/18/2011  CT HEAD WITHOUT CONTRAST Findings: No evidence for acute hemorrhage, hydrocephalus, mass lesion, or abnormal extra-axial fluid collection.  There is an area of focally decreased attenuation in the anterior aspect of the left basal ganglia which could be an area of acute to subacute ischemia. Patchy low attenuation in the deep hemispheric and periventricular white matter is nonspecific, but likely reflects chronic microvascular ischemic demyelination.  The visualized paranasal sinuses and mastoid air cells are clear.  IMPRESSION: Focal area of asymmetrically decreased attenuation in the left basal ganglia may be an area of acute to subacute ischemia.  This could also be related to asymmetric chronic underlying chronic small vessel microvascular disease as demonstrated elsewhere.  ERIC A. MANSELL, M.D.   10/19/2011 MRI HEAD  Findings:  Questionable tiny acute left pontine infarct (series 5 image 9) versus artifact.  No other evidence of acute infarct noted.  Moderate white matter type changes most consistent with result of small vessel disease.  No intracranial hemorrhage.  No intracranial mass lesion detected on this unenhanced exam.  No hydrocephalus.  Mild paranasal sinus mucosal thickening.  IMPRESSION: Questionable tiny acute left pontine infarct (series 5 image 9) versus artifact.  No other evidence of acute infarct noted.  Moderate white matter type changes most consistent with result of small vessel disease.  Mild paranasal sinus mucosal thickening.  Fuller Canada, M.D.  10/19/2011 MRA HEAD  Findings: Anterior circulation without medium or large  size vessel significant stenosis or occlusion.  No significant stenosis of the vertebral arteries or basilar artery.  Nonvisualization AICAs.  Mild branch vessel irregularity.  No aneurysm or vascular malformation noted.  IMPRESSION:  Mild branch vessel irregularity. Fuller Canada, M.D.   10/19/11 Carotid duplex   Preliminary report: Bilateral: No evidence of hemodynamically significant internal carotid artery stenosis. Vertebral artery flow is antegrade.  10/19/11 2 D ECHO: Left ventricle:Marland Kitchen Systolic function was normal. The estimated ejection fraction  55% to 60%. Wall motion was normal. - Aortic valve: Trileaflet; mildly thickened leaflets with mild leaflet prolapse. Trivial AI - Mitral valve: Mild regurgitation. - Pulmonic valve: Mild regurgitation.  Therapies: PT/OT:pending SLP:s/o  Assessment: 66 YO male with 3 week history of feeling off balance, dizzy and room spinning from right to left intermittently. 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 Work up otherwise unremarkable.  Would recommend PT for vestibular therapy.  Diabetes and hypertension are controlled.   LDL 89, goal <70  Risk factors for small vessel stroke; DM, HTN, hyperlipidemia  Plan: 1) Continue ASA daily 2)  PT for vestibular rehab; may continue meclizine as well.  3) agree with simvastatin.  No further neurologic intervention is recommended at this time.  If further questions arise, please call or page at that time.  Thank you for allowing neurology to participate in the care of this patient.  Patient seen and examined by Dr. Marjory Lies who agrees with above.  LOS: 2 days   Marya Fossa PA-C [facility] [phone] 10/20/2011  8:59 AM

## 2011-10-20 NOTE — Evaluation (Signed)
Occupational Therapy Evaluation Patient Details Name: Aaron Blevins MRN: [id number] DOB: 01-04-1946 Today's Date: 10/20/2011 Time: 4696-2952 OT Time Calculation (min): 19 min  OT Assessment / Plan / Recommendation Clinical Impression  This 66 y.o. male admitted for dizziness - worked up for possible CVA.  MRI with questionable small infarct in pontine vs. artifact.  Pt. demonstrates mild deficits with visual saccades to lt. and noted mild weakness of Lt. lateral rectus with saccades.  Deficits very mild and do not appear to affect him functionally.  No futher OT recommended    OT Assessment  Patient does not need any further OT services    Follow Up Recommendations  No OT follow up    Barriers to Discharge      Equipment Recommendations  None recommended by OT    Recommendations for Other Services    Frequency       Precautions / Restrictions Precautions Precautions: None Restrictions Weight Bearing Restrictions: No       ADL  Eating/Feeding: Performed;Independent Where Assessed - Eating/Feeding: Bed level Grooming: Simulated;Wash/dry hands;Wash/dry face;Teeth care;Independent Where Assessed - Grooming: Unsupported standing Upper Body Bathing: Simulated;Set up Where Assessed - Upper Body Bathing: Unsupported sitting Lower Body Bathing: Simulated;Set up Where Assessed - Lower Body Bathing: Unsupported sit to stand Upper Body Dressing: Simulated;Set up Where Assessed - Upper Body Dressing: Unsupported sitting Lower Body Dressing: Simulated;Set up Where Assessed - Lower Body Dressing: Unsupported sit to stand Toilet Transfer: Simulated;Independent Toilet Transfer Method: Sit to Barista: Comfort height toilet Toileting - Clothing Manipulation and Hygiene: Simulated;Independent Where Assessed - Toileting Clothing Manipulation and Hygiene: Standing Transfers/Ambulation Related to ADLs: Pt. ambulated with PT independently ADL Comments: Pt. reports he  got dressed this am with set up only    OT Diagnosis:    OT Problem List:   OT Treatment Interventions:     OT Goals    Visit Information  Last OT Received On: 10/20/11 Assistance Needed: +1    Subjective Data  Subjective: "Yeah, that got hard"  re: saccades to left Patient Stated Goal: to go home   Prior Functioning  Home Living Lives With: Spouse;Daughter Available Help at Discharge: Family Type of Home: House Home Access: Stairs to enter Secretary/administrator of Steps: 2 Entrance Stairs-Rails: None Home Layout: One level Bathroom Shower/Tub: Tub/shower unit;Walk-in shower Bathroom Toilet: Standard Home Adaptive Equipment: None Prior Function Level of Independence: Independent Able to Take Stairs?: Yes Driving: Yes Communication Communication: No difficulties Dominant Hand: Right    Cognition  Overall Cognitive Status: Appears within functional limits for tasks assessed/performed Arousal/Alertness: Awake/alert Orientation Level: Oriented X4 / Intact Behavior During Session: [facility] for tasks performed    Extremity/Trunk Assessment Right Upper Extremity Assessment RUE ROM/Strength/Tone: Within functional levels RUE Sensation: WFL - Light Touch RUE Coordination: WFL - gross/fine motor Left Upper Extremity Assessment LUE ROM/Strength/Tone: Within functional levels LUE Sensation: WFL - Light Touch LUE Coordination: WFL - gross/fine motor Trunk Assessment Trunk Assessment: Normal   Mobility Bed Mobility Bed Mobility: Supine to Sit;Sitting - Scoot to Edge of Bed Supine to Sit: 7: Independent Sitting - Scoot to [place] of Bed: 7: Independent Transfers Transfers: Sit to Stand;Stand to Sit Sit to Stand: 7: Independent Stand to Sit: 7: Independent   Exercise    Balance    End of Session OT - End of Session Activity Tolerance: Patient tolerated treatment well Patient left: in bed;with call bell/phone within reach;with family/visitor present Nurse Communication:  Other (comment) (results of OT eval)  Jeani Hawking M 10/20/2011, 5:26 PM

## 2011-10-20 NOTE — Progress Notes (Signed)
Patient and family given "[facility]: Stroke Education Manual" and information about low-sodium and low-carbohydrate diet. Risk factors, S/S of stroke, and stroke prevention explained to patient and family. All questions answered to patient and family's satisfaction. Pt D/C home with no signs of acute distress.

## 2011-10-20 NOTE — Evaluation (Signed)
Physical Therapy Evaluation Patient Details Name: Aaron Blevins MRN: [id number] DOB: [birth year] Today's Date: 10/20/2011 Time: 4098-1191 PT Time Calculation (min): 19 min  PT Assessment / Plan / Recommendation Clinical Impression  pt admitted with vertiginous s/s that were lasting for relatively long periods.  Now mostly resolved and not causing problems with safety. No follow up necessarty. D/C    PT Assessment  Patent does not need any further PT services    Follow Up Recommendations  No PT follow up    Barriers to Discharge        lEquipment Recommendations       Recommendations for Other Services     Frequency      Precautions / Restrictions Precautions Precautions: None   Pertinent Vitals/Pain       Mobility  Bed Mobility Bed Mobility: Supine to Sit;Sitting - Scoot to Edge of Bed Supine to Sit: 7: Independent Sitting - Scoot to [place] of Bed: 7: Independent Transfers Transfers: Sit to Stand;Stand to Sit Sit to Stand: 7: Independent Stand to Sit: 7: Independent Ambulation/Gait Ambulation/Gait Assistance: 7: Independent Ambulation Distance (Feet): 360 Feet Assistive device: None Gait Pattern: Within Functional Limits Gait velocity: safe level for the community Stairs: No    Exercises     PT Diagnosis:    PT Problem List:   PT Treatment Interventions:     PT Goals    Visit Information  Last PT Received On: 10/20/11 Assistance Needed: +1    Subjective Data  Subjective: I still feel a little strange every once in a while, but so much better Patient Stated Goal: Home I   Prior Functioning  Home Living Lives With: Spouse;Daughter Available Help at Discharge: Family Type of Home: House Home Access: Stairs to enter Secretary/administrator of Steps: 2 Entrance Stairs-Rails: None Home Layout: One level Bathroom Shower/Tub: Tub/shower unit;Walk-in shower Bathroom Toilet: Standard Home Adaptive Equipment: None Prior Function Level of Independence:  Independent Able to Take Stairs?: Yes Driving: Yes Communication Communication: No difficulties    Cognition  Overall Cognitive Status: Appears within functional limits for tasks assessed/performed Arousal/Alertness: Awake/alert Orientation Level: Oriented X4 / Intact Behavior During Session: [facility] for tasks performed    Extremity/Trunk Assessment Right Lower Extremity Assessment RLE ROM/Strength/Tone: Within functional levels Left Lower Extremity Assessment LLE ROM/Strength/Tone: Within functional levels Trunk Assessment Trunk Assessment: Normal   Balance Balance Balance Assessed: Yes Dynamic Standing Balance Dynamic Standing - Balance Support: During functional activity Dynamic Standing - Level of Assistance: 7: Independent High Level Balance High Level Balance Activites: Backward walking;Direction changes;Turns;Sudden stops;Head turns High Level Balance Comments:  (no LOB with more advanced activity)  End of Session PT - End of Session Activity Tolerance: Patient tolerated treatment well Patient left: in chair;with family/visitor present;with call bell/phone within reach Nurse Communication: Mobility status   Lamonica Trueba, Eliseo Gum 10/20/2011, 12:10 PM  10/20/2011  [place], PT [phone] (pager)

## 2011-10-20 NOTE — Progress Notes (Signed)
I evaluated and examined patient, reviewed pertinent data and imaging myself, and agree with assessment and plan as above. Possible small pontine stroke vs. Artifact. Could be peripheral vestibulopathy. Signs of peripheral neuropathy on exam. Will sign off. Can follow up with Dr. Pearlean Brownie as outpatient ([facility] [phone] in 4-6 weeks).  Zell Hylton 10/20/2011 11:31 AM

## 2011-10-20 NOTE — Care Management Note (Unsigned)
    Page 1 of 1   10/20/2011     12:06:58 PM   CARE MANAGEMENT NOTE 10/20/2011  Patient:  Aaron Blevins,Aaron Blevins   Account Number:  [id number]  Date Initiated:  10/19/2011  Documentation initiated by:  Aaron Blevins [facility]  Subjective/Objective Assessment:   Admitted with stroke.Lives with spouse.     Action/Plan:   PT eval-no d/c needs  OT eval-no d/c needs   Anticipated DC Date:  10/20/2011   Anticipated DC Plan:  HOME/SELF CARE      DC Planning Services  CM consult      Choice offered to / List presented to:             Status of service:  In process, will continue to follow Medicare Important Message given?   (If response is "NO", the following Medicare IM given date fields will be blank) Date Medicare IM given:   Date Additional Medicare IM given:    Discharge Disposition:    Per UR Regulation:  Reviewed for med. necessity/level of care/duration of stay  If discussed at Long Length of Stay Meetings, dates discussed:    Comments:  10/20/11 Spoke with patient and wife about PCP, patient goes to [facility] in [place] and Mrs Aaron Blevins states that she is going to make f/u appt for patient. Patient walked in the hall with PT and did well, no d/c needs identified. Will continue to follow. Aaron Cree RN, BSN, CCM

## 2011-10-20 NOTE — Clinical Social Work Note (Signed)
CSW received "SNF." Discussed pt with RN in progression rounds. PT is not recommending any follow up. RNCM is aware. CSW is signing off as no further needs identified. Please reconsult if a need arises prior to discharge.   Dede Query, MSW, Theresia Majors [phone]

## 2012-01-07 ENCOUNTER — Other Ambulatory Visit: Payer: Self-pay | Admitting: Diagnostic Neuroimaging

## 2012-01-07 DIAGNOSIS — R413 Other amnesia: Secondary | ICD-10-CM

## 2012-01-12 ENCOUNTER — Ambulatory Visit
Admission: RE | Admit: 2012-01-12 | Discharge: 2012-01-12 | Disposition: A | Discharge status: Home / Self Care | Payer: Medicare Other | Source: Ambulatory Visit | Attending: Diagnostic Neuroimaging | Admitting: Diagnostic Neuroimaging

## 2012-01-12 DIAGNOSIS — R413 Other amnesia: Secondary | ICD-10-CM

## 2012-01-12 MED ORDER — GADOBENATE DIMEGLUMINE 529 MG/ML IV SOLN
17.0000 mL | Freq: Once | INTRAVENOUS | Status: AC | PRN
  Administered 2012-01-12: 17 mL via INTRAVENOUS

## 2012-01-13 ENCOUNTER — Other Ambulatory Visit: Payer: Medicare Other

## 2012-04-04 ENCOUNTER — Emergency Department (HOSPITAL_BASED_OUTPATIENT_CLINIC_OR_DEPARTMENT_OTHER): Payer: Medicare Other

## 2012-04-04 ENCOUNTER — Emergency Department (HOSPITAL_BASED_OUTPATIENT_CLINIC_OR_DEPARTMENT_OTHER)
Admission: EM | Admit: 2012-04-04 | Discharge: 2012-04-04 | Disposition: A | Discharge status: Home / Self Care | Payer: Medicare Other | Attending: Emergency Medicine | Admitting: Emergency Medicine

## 2012-04-04 ENCOUNTER — Encounter (HOSPITAL_BASED_OUTPATIENT_CLINIC_OR_DEPARTMENT_OTHER): Payer: Self-pay | Admitting: Emergency Medicine

## 2012-04-04 DIAGNOSIS — Z794 Long term (current) use of insulin: Secondary | ICD-10-CM | POA: Insufficient documentation

## 2012-04-04 DIAGNOSIS — E119 Type 2 diabetes mellitus without complications: Secondary | ICD-10-CM | POA: Insufficient documentation

## 2012-04-04 DIAGNOSIS — K219 Gastro-esophageal reflux disease without esophagitis: Secondary | ICD-10-CM

## 2012-04-04 DIAGNOSIS — R1310 Dysphagia, unspecified: Secondary | ICD-10-CM

## 2012-04-04 DIAGNOSIS — Z7982 Long term (current) use of aspirin: Secondary | ICD-10-CM | POA: Insufficient documentation

## 2012-04-04 DIAGNOSIS — Z79899 Other long term (current) drug therapy: Secondary | ICD-10-CM | POA: Insufficient documentation

## 2012-04-04 DIAGNOSIS — Z8673 Personal history of transient ischemic attack (TIA), and cerebral infarction without residual deficits: Secondary | ICD-10-CM | POA: Insufficient documentation

## 2012-04-04 DIAGNOSIS — I10 Essential (primary) hypertension: Secondary | ICD-10-CM | POA: Insufficient documentation

## 2012-04-04 DIAGNOSIS — R1319 Other dysphagia: Secondary | ICD-10-CM

## 2012-04-04 DIAGNOSIS — R51 Headache: Secondary | ICD-10-CM | POA: Insufficient documentation

## 2012-04-04 HISTORY — DX: Cerebral infarction, unspecified: I63.9

## 2012-04-04 LAB — CBC WITH DIFFERENTIAL/PLATELET
Basophils Absolute: 0 10*3/uL (ref 0.0–0.1)
Basophils Relative: 1 % (ref 0–1)
Eosinophils Absolute: 0.3 10*3/uL (ref 0.0–0.7)
Eosinophils Relative: 4 % (ref 0–5)
HCT: 34.6 % — ABNORMAL LOW (ref 39.0–52.0)
Hemoglobin: 12.1 g/dL — ABNORMAL LOW (ref 13.0–17.0)
Lymphocytes Relative: 29 % (ref 12–46)
Lymphs Abs: 1.8 10*3/uL (ref 0.7–4.0)
MCH: 30 pg (ref 26.0–34.0)
MCHC: 35 g/dL (ref 30.0–36.0)
MCV: 85.9 fL (ref 78.0–100.0)
Monocytes Absolute: 0.9 10*3/uL (ref 0.1–1.0)
Monocytes Relative: 14 % — ABNORMAL HIGH (ref 3–12)
Neutro Abs: 3.3 10*3/uL (ref 1.7–7.7)
Neutrophils Relative %: 53 % (ref 43–77)
Platelets: 144 10*3/uL — ABNORMAL LOW (ref 150–400)
RBC: 4.03 MIL/uL — ABNORMAL LOW (ref 4.22–5.81)
RDW: 12.1 % (ref 11.5–15.5)
WBC: 6.4 10*3/uL (ref 4.0–10.5)

## 2012-04-04 LAB — BASIC METABOLIC PANEL
BUN: 7 mg/dL (ref 6–23)
CO2: 29 mEq/L (ref 19–32)
Calcium: 9.1 mg/dL (ref 8.4–10.5)
Chloride: 100 mEq/L (ref 96–112)
Creatinine, Ser: 1.1 mg/dL (ref 0.50–1.35)
GFR calc Af Amer: 78 mL/min — ABNORMAL LOW (ref >90)
GFR calc non Af Amer: 68 mL/min — ABNORMAL LOW (ref >90)
Glucose, Bld: 102 mg/dL — ABNORMAL HIGH (ref 70–99)
Potassium: 3.4 mEq/L — ABNORMAL LOW (ref 3.5–5.1)
Sodium: 138 mEq/L (ref 135–145)

## 2012-04-04 MED ORDER — ACETAMINOPHEN 160 MG/5ML PO SOLN: 975.0000 mg | Freq: Four times a day (QID) | ORAL | Status: DC | PRN

## 2012-04-04 MED ORDER — ACETAMINOPHEN 160 MG/5ML PO SOLN
975.0000 mg | Freq: Once | ORAL | Status: AC
  Administered 2012-04-04: 975 mg via ORAL
  Filled 2012-04-04: qty 40.6

## 2012-04-04 MED ORDER — SUCRALFATE 1 GM/10ML PO SUSP: 1.0000 g | Freq: Four times a day (QID) | ORAL | Status: DC

## 2012-04-04 NOTE — ED Notes (Signed)
Pt given ice water to evaluate ability to swallow liquids. Pt able to swallow water. Pt states if feels like a lump in his throat.

## 2012-04-04 NOTE — ED Notes (Signed)
Patient transported to CT 

## 2012-04-04 NOTE — ED Provider Notes (Addendum)
History     CSN: [id number]  Arrival date & time 04/04/12  4098   First MD Initiated Contact with Patient 04/04/12 [phone]      Chief Complaint  Patient presents with  . Dysphagia  . Headache    (Consider location/radiation/quality/duration/timing/severity/associated sxs/prior treatment) Patient is a 66 y.o. male presenting with foreign body swallowed. The history is provided by the patient. No language interpreter was used.  Swallowed Foreign Body This is a recurrent problem. The current episode started more than 1 week ago. The problem occurs daily. The problem has been gradually worsening. Pertinent negatives include no chest pain, no abdominal pain and no shortness of breath. The symptoms are aggravated by eating and drinking. Nothing relieves the symptoms. He has tried nothing for the symptoms. The treatment provided no relief.  Patient had a stroke in June and is also known to have GERD that is poorly controlled.  Moreover, states his daughter has esophageal narrowing that required dilation.  He states that for several months at least since August he has had progressive solids to liquids dysphagia and a feeling that food is getting stuck and points to below hyoid bone. Also now has headache that is frontal and gradual onset that he states is secondary to not eating since Thursday.  He is taking his meds and is able to swallow his own secretions but the problem is getting worse.  No swallowing anything causes pain and feels like it gets stuck.  Was supposed to see a GI specialist in [place] yesterday but they canceled due to physician illness and did not reschedule and told patient to come to ED if sx persist.    Denies sudden onset HA.  Gaylyn Rong is mild and frontal.  There is no temporal pain. No f/c/r.  No neck pain nor stiffness.  No rashes.  No changes in vision.  No changes in making nor understanding speech.  No weakness nor numbness that are new since stroke.  No CP no SOB.  No change in voice  quality nor sore throat.  No vomiting.    Past Medical History  Diagnosis Date  . Diabetes mellitus   . Hypertension   . GERD (gastroesophageal reflux disease)   . Stroke     History reviewed. No pertinent past surgical history.  No family history on file.  History  Substance Use Topics  . Smoking status: Never Smoker   . Smokeless tobacco: Never Used  . Alcohol Use: No      Review of Systems  Constitutional: Negative for fever.  HENT: Positive for trouble swallowing. Negative for sore throat, facial swelling, drooling, neck pain, neck stiffness and voice change.   Respiratory: Negative for shortness of breath.   Cardiovascular: Negative for chest pain.  Gastrointestinal: Negative for abdominal pain.  Neurological: Negative for dizziness, facial asymmetry, weakness and numbness.  All other systems reviewed and are negative.    Allergies  Review of patient's allergies indicates no known allergies.  Home Medications   Current Outpatient Rx  Name  Route  Sig  Dispense  Refill  . ASPIRIN 81 MG PO TBEC   Oral   Take 1 tablet (81 mg total) by mouth daily.   30 tablet   1   . GLIPIZIDE 10 MG PO TABS   Oral   Take 10 mg by mouth 2 (two) times daily before a meal.           . INSULIN GLARGINE 100 UNIT/ML [place] SOLN   Subcutaneous  Inject 10 Units into the skin at bedtime.         Marland Kitchen KETOCONAZOLE 2 % EX CREA   Topical   Apply topically 2 (two) times daily. Apply to neck area; keep skin clean and dry.   15 g   0   . LISINOPRIL 10 MG PO TABS   Oral   Take 10 mg by mouth daily.           Marland Kitchen METFORMIN HCL 1000 MG PO TABS   Oral   Take 1,000 mg by mouth 2 (two) times daily with a meal.           . PRAVASTATIN SODIUM 40 MG PO TABS   Oral   Take 1 tablet (40 mg total) by mouth daily.   30 tablet   1   . RANITIDINE HCL PO   Oral   Take 1 tablet by mouth daily. Patient uses this medication for acid reflux.           BP 156/101  Pulse 78  Temp 98.4  F (36.9 C) (Oral)  Resp 18  Ht 5\' 6"  (1.676 m)  Wt 180 lb (81.647 kg)  BMI 29.05 kg/m2  SpO2 97%  Physical Exam  Constitutional: He is oriented to person, place, and time. He appears well-developed and well-nourished. No distress.  HENT:  Head: Normocephalic and atraumatic.  Right Ear: External ear normal.  Left Ear: External ear normal.  Mouth/Throat: Oropharynx is clear and moist. No oropharyngeal exudate.  Eyes: Conjunctivae normal and EOM are normal. Pupils are equal, round, and reactive to light.  Neck: Normal range of motion. Neck supple. No tracheal deviation present. No thyromegaly present.  Cardiovascular: Normal rate, regular rhythm and intact distal pulses.   Pulmonary/Chest: Effort normal and breath sounds normal. No stridor. He has no wheezes. He has no rales.  Abdominal: Soft. Bowel sounds are normal. There is no tenderness. There is no rebound and no guarding.  Musculoskeletal: Normal range of motion. He exhibits no edema.  Lymphadenopathy:    He has no cervical adenopathy.  Neurological: He is alert and oriented to person, place, and time. He has normal reflexes. He displays normal reflexes. No cranial nerve deficit. He exhibits normal muscle tone. GCS eye subscore is 4. GCS verbal subscore is 5. GCS motor subscore is 6.       No temporal pain, intact steady gait to room  5/5 throughout  Skin: Skin is warm and dry.  Psychiatric: He has a normal mood and affect.    ED Course  Procedures (including critical care time)  Labs Reviewed  CBC WITH DIFFERENTIAL - Abnormal; Notable for the following:    RBC 4.03 (*)     Hemoglobin 12.1 (*)     HCT 34.6 (*)     Platelets 144 (*)     Monocytes Relative 14 (*)     All other components within normal limits  BASIC METABOLIC PANEL - Abnormal; Notable for the following:    Potassium 3.4 (*)     Glucose, Bld 102 (*)     GFR calc non Af Amer 68 (*)     GFR calc Af Amer 78 (*)     All other components within normal limits   URINALYSIS, ROUTINE W REFLEX MICROSCOPIC   Dg Chest 2 View  04/04/2012  *RADIOLOGY REPORT*  Clinical Data: Dysphagia  CHEST - 2 VIEW  Comparison: 10/19/2011  Findings: Lungs are clear. No pleural effusion or pneumothorax. The cardiomediastinal contours  are within normal limits. The visualized bones and soft tissues are without significant appreciable abnormality. Mild multilevel degenerative change and vertebral body height loss, similar to prior.  IMPRESSION: No radiographic evidence of acute cardiopulmonary process.   Original Report Authenticated By: Jearld Lesch, M.D.    Ct Head Wo Contrast  04/04/2012  *RADIOLOGY REPORT*  Clinical Data: Headache  CT HEAD WITHOUT CONTRAST  Technique:  Contiguous axial images were obtained from the base of the skull through the vertex without contrast.  Comparison: 01/12/2012 MRI  Findings: Periventricular and subcortical white matter hypodensities are most in keeping with chronic microangiopathic change. There is no evidence for acute hemorrhage, hydrocephalus, mass lesion, or abnormal extra-axial fluid collection.  No definite CT evidence for acute infarction.  The visualized paranasal sinuses and mastoid air cells are predominately clear.  IMPRESSION: White matter changes as above.  No definite CT evidence for acute intracranial abnormality.   Original Report Authenticated By: Jearld Lesch, M.D.      No diagnosis found.    MDM  No changes in CT.  No new deficits on exam or per patient report following exam and review of previous CT and admission records. Solids to liquids dysphagia over months, suspect stricture/ web/ ring.  Will need GI eval.     555 case d/w Dr. Evette Cristal of GI call office to be fitted in this week.  He took patient's information.    Patient able to po challenge with liquids, no vomiting, liquids stayed down x > 30 minutes  Have educated family regarding need for aggressive oral liquid hydration until seen by GI.  Return for neck  swelling, drooling, vomiting, shortness of breath, weakness numbness changes in speech or any concerns.  Patient verbalizes understanding and agrees to follow up       Katalyn Matin K Mouhamed Glassco-Rasch, MD 04/04/12 0630  Annistyn Depass K Caitlan Chauca-Rasch, MD 04/04/12 [phone]

## 2012-04-04 NOTE — ED Notes (Signed)
Pt reports difficulty swallowing. Pt states he is unable to swallow any food. Pt had apt with GI yesterday for same, but MD was sick.

## 2012-04-04 NOTE — ED Notes (Signed)
Pt able to swallow saliva.

## 2012-04-04 NOTE — ED Notes (Signed)
MD at bedside. 

## 2012-04-06 ENCOUNTER — Ambulatory Visit
Admission: RE | Admit: 2012-04-06 | Discharge: 2012-04-06 | Disposition: A | Discharge status: Home / Self Care | Payer: Medicare Other | Source: Ambulatory Visit | Attending: Gastroenterology | Admitting: Gastroenterology

## 2012-04-06 ENCOUNTER — Other Ambulatory Visit: Payer: Self-pay | Admitting: Gastroenterology

## 2012-04-06 DIAGNOSIS — R1310 Dysphagia, unspecified: Secondary | ICD-10-CM

## 2012-04-10 ENCOUNTER — Encounter (HOSPITAL_COMMUNITY): Payer: Self-pay | Admitting: *Deleted

## 2012-04-11 ENCOUNTER — Encounter (HOSPITAL_COMMUNITY): Payer: Self-pay | Admitting: Pharmacy Technician

## 2012-04-12 ENCOUNTER — Encounter (HOSPITAL_COMMUNITY): Payer: Self-pay | Admitting: *Deleted

## 2012-04-12 ENCOUNTER — Encounter (HOSPITAL_COMMUNITY): Payer: Self-pay | Admitting: Anesthesiology

## 2012-04-12 ENCOUNTER — Encounter (HOSPITAL_COMMUNITY)
Admission: RE | Disposition: A | Discharge status: Home / Self Care | Payer: Self-pay | Source: Ambulatory Visit | Attending: Gastroenterology

## 2012-04-12 ENCOUNTER — Ambulatory Visit (HOSPITAL_COMMUNITY): Payer: Medicare Other | Admitting: Anesthesiology

## 2012-04-12 ENCOUNTER — Ambulatory Visit (HOSPITAL_COMMUNITY)
Admission: RE | Admit: 2012-04-12 | Discharge: 2012-04-12 | Disposition: A | Discharge status: Home / Self Care | Payer: Medicare Other | Source: Ambulatory Visit | Attending: Gastroenterology | Admitting: Gastroenterology

## 2012-04-12 DIAGNOSIS — K449 Diaphragmatic hernia without obstruction or gangrene: Secondary | ICD-10-CM | POA: Insufficient documentation

## 2012-04-12 DIAGNOSIS — E119 Type 2 diabetes mellitus without complications: Secondary | ICD-10-CM | POA: Insufficient documentation

## 2012-04-12 DIAGNOSIS — K219 Gastro-esophageal reflux disease without esophagitis: Secondary | ICD-10-CM | POA: Insufficient documentation

## 2012-04-12 DIAGNOSIS — I10 Essential (primary) hypertension: Secondary | ICD-10-CM | POA: Insufficient documentation

## 2012-04-12 DIAGNOSIS — Z8673 Personal history of transient ischemic attack (TIA), and cerebral infarction without residual deficits: Secondary | ICD-10-CM | POA: Insufficient documentation

## 2012-04-12 DIAGNOSIS — Z79899 Other long term (current) drug therapy: Secondary | ICD-10-CM | POA: Insufficient documentation

## 2012-04-12 DIAGNOSIS — R1310 Dysphagia, unspecified: Secondary | ICD-10-CM | POA: Insufficient documentation

## 2012-04-12 DIAGNOSIS — K222 Esophageal obstruction: Secondary | ICD-10-CM | POA: Insufficient documentation

## 2012-04-12 HISTORY — PX: ESOPHAGOGASTRODUODENOSCOPY (EGD) WITH PROPOFOL: SHX5813

## 2012-04-12 HISTORY — DX: Unspecified osteoarthritis, unspecified site: M19.90

## 2012-04-12 HISTORY — DX: Dysphagia, unspecified: R13.10

## 2012-04-12 HISTORY — PX: BALLOON DILATION: SHX5330

## 2012-04-12 LAB — GLUCOSE, CAPILLARY
Glucose-Capillary: 92 mg/dL (ref 70–99)
Glucose-Capillary: 83 mg/dL (ref 70–99)

## 2012-04-12 SURGERY — ESOPHAGOGASTRODUODENOSCOPY (EGD) WITH PROPOFOL
Anesthesia: Monitor Anesthesia Care

## 2012-04-12 MED ORDER — PROMETHAZINE HCL 25 MG/ML IJ SOLN: 6.2500 mg | INTRAMUSCULAR | Status: DC | PRN

## 2012-04-12 MED ORDER — BUTAMBEN-TETRACAINE-BENZOCAINE 2-2-14 % EX AERO
INHALATION_SPRAY | CUTANEOUS | Status: DC | PRN
  Administered 2012-04-12: 2 via TOPICAL

## 2012-04-12 MED ORDER — KETAMINE HCL 10 MG/ML IJ SOLN
INTRAMUSCULAR | Status: DC | PRN
  Administered 2012-04-12 (×2): 5 mg via INTRAVENOUS

## 2012-04-12 MED ORDER — ONDANSETRON HCL 4 MG/2ML IJ SOLN
INTRAMUSCULAR | Status: DC | PRN
  Administered 2012-04-12: 2 mg via INTRAVENOUS

## 2012-04-12 MED ORDER — FENTANYL CITRATE 0.05 MG/ML IJ SOLN
INTRAMUSCULAR | Status: DC | PRN
  Administered 2012-04-12 (×2): 25 ug via INTRAVENOUS
  Administered 2012-04-12: 50 ug via INTRAVENOUS

## 2012-04-12 MED ORDER — LACTATED RINGERS IV SOLN
INTRAVENOUS | Status: DC
  Administered 2012-04-12: 1000 mL via INTRAVENOUS

## 2012-04-12 MED ORDER — PROPOFOL 10 MG/ML IV EMUL
INTRAVENOUS | Status: DC | PRN
  Administered 2012-04-12: 100 ug/kg/min via INTRAVENOUS

## 2012-04-12 MED ORDER — SODIUM CHLORIDE 0.9 % IV SOLN: INTRAVENOUS | Status: DC

## 2012-04-12 MED ORDER — LACTATED RINGERS IV SOLN
INTRAVENOUS | Status: DC | PRN
  Administered 2012-04-12: 11:00:00 via INTRAVENOUS

## 2012-04-12 MED ORDER — LACTATED RINGERS IV SOLN: INTRAVENOUS | Status: DC

## 2012-04-12 MED ORDER — MIDAZOLAM HCL 5 MG/5ML IJ SOLN
INTRAMUSCULAR | Status: DC | PRN
  Administered 2012-04-12 (×2): 1 mg via INTRAVENOUS

## 2012-04-12 SURGICAL SUPPLY — 14 items

## 2012-04-12 NOTE — Anesthesia Preprocedure Evaluation (Signed)
Anesthesia Evaluation  Patient identified by MRN, date of birth, ID band Patient awake    Reviewed: Allergy & Precautions, H&P , NPO status , Patient's Chart, lab work & pertinent test results  Airway Mallampati: II TM Distance: >3 FB Neck ROM: Full    Dental  (+) Edentulous Upper and Edentulous Lower   Pulmonary neg pulmonary ROS,  breath sounds clear to auscultation  Pulmonary exam normal       Cardiovascular hypertension, negative cardio ROS  Rhythm:Regular Rate:Normal     Neuro/Psych CVA negative neurological ROS  negative psych ROS   GI/Hepatic Neg liver ROS, GERD-  Medicated,  Endo/Other  diabetes, Type 2, Oral Hypoglycemic Agents  Renal/GU negative Renal ROS  negative genitourinary   Musculoskeletal negative musculoskeletal ROS (+)   Abdominal   Peds  Hematology negative hematology ROS (+)   Anesthesia Other Findings   Reproductive/Obstetrics negative OB ROS                           Anesthesia Physical Anesthesia Plan  ASA: III  Anesthesia Plan: MAC   Post-op Pain Management:    Induction: Intravenous  Airway Management Planned: Nasal Cannula  Additional Equipment:   Intra-op Plan:   Post-operative Plan:   Informed Consent: I have reviewed the patients History and Physical, chart, labs and discussed the procedure including the risks, benefits and alternatives for the proposed anesthesia with the patient or authorized representative who has indicated his/her understanding and acceptance.   Dental advisory given  Plan Discussed with: CRNA  Anesthesia Plan Comments:         Anesthesia Quick Evaluation

## 2012-04-12 NOTE — Transfer of Care (Signed)
Immediate Anesthesia Transfer of Care Note  Patient: Aaron Blevins  Procedure(s) Performed: Procedure(s) (LRB) with comments: ESOPHAGOGASTRODUODENOSCOPY (EGD) WITH PROPOFOL (N/A) BALLOON DILATION (N/A)  Patient Location: PACU  Anesthesia Type:MAC  Level of Consciousness: awake, alert , oriented and patient cooperative  Airway & Oxygen Therapy: Patient Spontanous Breathing and Patient connected to nasal cannula oxygen  Post-op Assessment: Report given to PACU RN and Post -op Vital signs reviewed and stable  Post vital signs: Reviewed and stable  Complications: No apparent anesthesia complications

## 2012-04-12 NOTE — Anesthesia Postprocedure Evaluation (Signed)
Anesthesia Post Note  Patient: Aaron Blevins  Procedure(s) Performed: Procedure(s) (LRB): ESOPHAGOGASTRODUODENOSCOPY (EGD) WITH PROPOFOL (N/A) BALLOON DILATION (N/A)  Anesthesia type: MAC  Patient location: PACU  Post pain: Pain level controlled  Post assessment: Post-op Vital signs reviewed  Last Vitals:  Filed Vitals:   04/12/12 1230  BP: 149/86  Temp:   Resp:     Post vital signs: Reviewed  Level of consciousness: sedated  Complications: No apparent anesthesia complications

## 2012-04-12 NOTE — Op Note (Signed)
[facility] [address] [place], [address]   ENDOSCOPY PROCEDURE REPORT  PATIENT: Aaron, Blevins  MR#: [id number] BIRTHDATE: 1945-07-03 , 66  yrs. old GENDER: Male ENDOSCOPIST: Willis Modena, MD REFERRED BY:  April Palumbo-Rasch, MD PROCEDURE DATE:  04/12/2012 PROCEDURE:  EGD w/ biopsy and Maloney dilation of esophagus ASA CLASS:     Class III INDICATIONS:  dysphagia, abnormal esophagram. MEDICATIONS: MAC sedation, administered by CRNA TOPICAL ANESTHETIC: Cetacaine Spray  DESCRIPTION OF PROCEDURE: After the risks benefits and alternatives of the procedure were thoroughly explained, informed consent was obtained.  The    endoscope was introduced through the mouth and advanced to the second portion of the duodenum. Without limitations.  The instrument was slowly withdrawn as the mucosa was fully examined.    Findings:  Medium-sized hiatal hernia, associated with widely patent Schatzki's ring, dilated (at end of procedure) with 18mm TTS balloon dilating catheter (50 seconds, no resistance).  Esophageal mucosa had multiple linear furrows and trachealized appearance, biopsied to assess for eosinophilic esophagitis.  No proximal or mid esophageal narrowing was seen, despite findings of luminal narrowing on his recent esophagram.  No esophageal mass, and no other stricturing of the esophagus was seen.  Stomach had few erosions, otherwise normal.  Normal pylorus and duodenum to the second portion.       Retroflexed views revealed no abnormalities. The scope was then withdrawn from the patient and the procedure completed.  ENDOSCOPIC IMPRESSION:     As above.  Endoscopic appearance suggestive of eosinophilic esophagitis as source of his dysphagia. Doubt Schatzki's ring is playing significant role.  RECOMMENDATIONS:     1.  Watch for potential complications of procedure. 2.  Await biopsy results. 3.  Avoid fibrous meats, raw veggies, and hard-crusted breads  for the near-future. 4.  Continue omeprazole and rantidine for the time-being, pending biopsy results. 5.  Follow-up in [place] [facility] once biopsy results are available.  eSigned:  Willis Modena, MD 04/12/2012 11:39 AM   CC:

## 2012-04-12 NOTE — H&P (Signed)
Patient interval history reviewed.  Patient examined again.  There has been no change from documented H/P dated 04/06/2012 (scanned into chart from our office) except as documented above.  Assessment:  1.  Dysphagia. 2.  Abnormal barium swallow.  Plan:  1.  Endoscopy with possible esophageal balloon (TTS) dilatation. 2.  Risks (bleeding, infection, bowel perforation that could require surgery, sedation-related changes in cardiopulmonary systems), benefits (identification and possible treatment of source of symptoms, exclusion of certain causes of symptoms), and alternatives (watchful waiting, radiographic imaging studies, empiric medical treatment) of upper endoscopy with esophageal dilatation (EGD +/- DIL) were explained to patient in detail and he wishes to proceed.

## 2012-04-17 ENCOUNTER — Encounter (HOSPITAL_COMMUNITY): Payer: Self-pay | Admitting: Gastroenterology

## 2013-02-15 ENCOUNTER — Emergency Department (HOSPITAL_BASED_OUTPATIENT_CLINIC_OR_DEPARTMENT_OTHER)
Admission: EM | Admit: 2013-02-15 | Discharge: 2013-02-15 | Disposition: A | Discharge status: Home / Self Care | Payer: Medicare Other | Attending: Emergency Medicine | Admitting: Emergency Medicine

## 2013-02-15 ENCOUNTER — Encounter (HOSPITAL_BASED_OUTPATIENT_CLINIC_OR_DEPARTMENT_OTHER): Payer: Self-pay | Admitting: *Deleted

## 2013-02-15 DIAGNOSIS — J329 Chronic sinusitis, unspecified: Secondary | ICD-10-CM | POA: Insufficient documentation

## 2013-02-15 DIAGNOSIS — K219 Gastro-esophageal reflux disease without esophagitis: Secondary | ICD-10-CM | POA: Insufficient documentation

## 2013-02-15 DIAGNOSIS — R059 Cough, unspecified: Secondary | ICD-10-CM | POA: Insufficient documentation

## 2013-02-15 DIAGNOSIS — R05 Cough: Secondary | ICD-10-CM | POA: Insufficient documentation

## 2013-02-15 DIAGNOSIS — Z8739 Personal history of other diseases of the musculoskeletal system and connective tissue: Secondary | ICD-10-CM | POA: Insufficient documentation

## 2013-02-15 DIAGNOSIS — E119 Type 2 diabetes mellitus without complications: Secondary | ICD-10-CM | POA: Insufficient documentation

## 2013-02-15 DIAGNOSIS — R51 Headache: Secondary | ICD-10-CM | POA: Insufficient documentation

## 2013-02-15 DIAGNOSIS — Z79899 Other long term (current) drug therapy: Secondary | ICD-10-CM | POA: Insufficient documentation

## 2013-02-15 DIAGNOSIS — I10 Essential (primary) hypertension: Secondary | ICD-10-CM | POA: Insufficient documentation

## 2013-02-15 DIAGNOSIS — Z8673 Personal history of transient ischemic attack (TIA), and cerebral infarction without residual deficits: Secondary | ICD-10-CM | POA: Insufficient documentation

## 2013-02-15 DIAGNOSIS — Z794 Long term (current) use of insulin: Secondary | ICD-10-CM | POA: Insufficient documentation

## 2013-02-15 MED ORDER — AMOXICILLIN 500 MG PO CAPS
ORAL_CAPSULE | ORAL | Status: AC
  Administered 2013-02-15: 19:00:00
  Filled 2013-02-15: qty 1

## 2013-02-15 MED ORDER — AMOXICILLIN 500 MG PO CAPS: 500.0000 mg | ORAL_CAPSULE | Freq: Three times a day (TID) | ORAL | Status: DC

## 2013-02-15 NOTE — ED Provider Notes (Signed)
CSN: [id number]     Arrival date & time 02/15/13  1831 History   First MD Initiated Contact with Patient 02/15/13 1839     Chief Complaint  Patient presents with  . Nasal Congestion   (Consider location/radiation/quality/duration/timing/severity/associated sxs/prior Treatment) HPI Comments: Pt states that he started with headache, congestion and right sided facial pain for the last 2 days:pt states that he does have a history of sinusitis and it feels the same;denies trying anything at home;doesn't think that he has had a fever:also has had a cough  The history is provided by the patient and the spouse. No language interpreter was used.    Past Medical History  Diagnosis Date  . Diabetes mellitus   . Hypertension   . GERD (gastroesophageal reflux disease)   . Trouble swallowing   . Arthritis     RIGHT KNEE  . Stroke     JUNE 2013--HAD TERRIBLE H/A--NO DEFICITS   Past Surgical History  Procedure Laterality Date  . Circumcision    . Esophagogastroduodenoscopy (egd) with propofol  04/12/2012    Procedure: ESOPHAGOGASTRODUODENOSCOPY (EGD) WITH PROPOFOL;  Surgeon: Willis Modena, MD;  Location: [facility] ENDOSCOPY;  Service: Endoscopy;  Laterality: N/A;  . Balloon dilation  04/12/2012    Procedure: BALLOON DILATION;  Surgeon: Willis Modena, MD;  Location: [facility] ENDOSCOPY;  Service: Endoscopy;  Laterality: N/A;   History reviewed. No pertinent family history. History  Substance Use Topics  . Smoking status: Never Smoker   . Smokeless tobacco: Never Used  . Alcohol Use: No    Review of Systems  Constitutional: Negative.   HENT: Positive for congestion.   Respiratory: Positive for cough.   Cardiovascular: Negative.   Neurological: Positive for headaches.    Allergies  Review of patient's allergies indicates no known allergies.  Home Medications   Current Outpatient Rx  Name  Route  Sig  Dispense  Refill  . amoxicillin (AMOXIL) 500 MG capsule   Oral   Take 1 capsule (500 mg  total) by mouth 3 (three) times daily.   30 capsule   0   . glipiZIDE (GLUCOTROL) 5 MG tablet   Oral   Take 2.5 mg by mouth every morning. 1/2 TABLET DAILY         . insulin glargine (LANTUS) 100 UNIT/ML injection   Subcutaneous   Inject 10 Units into the skin at bedtime.         Marland Kitchen lisinopril (PRINIVIL,ZESTRIL) 5 MG tablet   Oral   Take 5 mg by mouth every morning.          . metFORMIN (GLUCOPHAGE) 500 MG tablet   Oral   Take 1,000 mg by mouth 2 (two) times daily with a meal.         . omeprazole (PRILOSEC) 40 MG capsule   Oral   Take 40 mg by mouth 2 (two) times daily. BEFORE A MEAL         . EXPIRED: pravastatin (PRAVACHOL) 40 MG tablet   Oral   Take 40 mg by mouth every morning.          . sucralfate (CARAFATE) 1 GM/10ML suspension   Oral   Take 1 g by mouth 2 (two) times daily.          BP 163/90  Pulse 64  Temp(Src) 99.4 F (37.4 C) (Oral)  Resp 18  Ht 5\' 7"  (1.702 m)  Wt 184 lb (83.462 kg)  BMI 28.81 kg/m2  SpO2 96% Physical Exam  Nursing  note and vitals reviewed. Constitutional: He is oriented to person, place, and time. He appears well-developed and well-nourished.  HENT:  Right Ear: External ear normal.  Left Ear: External ear normal.  Nose: Mucosal edema present. Left sinus exhibits maxillary sinus tenderness and frontal sinus tenderness.  Eyes: Conjunctivae and EOM are normal.  Neck: Neck supple.  Cardiovascular: Normal rate and regular rhythm.   Pulmonary/Chest: Effort normal and breath sounds normal.  Musculoskeletal: Normal range of motion.  Neurological: He is alert and oriented to person, place, and time.  Skin: Skin is warm and dry.  Psychiatric: He has a normal mood and affect.    ED Course  Procedures (including critical care time) Labs Review Labs Reviewed - No data to display Imaging Review No results found.  MDM   1. Sinusitis    Will treat for sinsusitis    Teressa Lower, NP 02/15/13 1909

## 2013-02-15 NOTE — ED Provider Notes (Signed)
  Medical screening examination/treatment/procedure(s) were performed by non-physician practitioner and as supervising physician I was immediately available for consultation/collaboration.   Aaliyah Cancro, MD 02/15/13 2321 

## 2013-02-15 NOTE — ED Notes (Signed)
Pt c/o sinus congestion , facial pain and h/a x 2 days

## 2013-05-13 ENCOUNTER — Emergency Department (HOSPITAL_BASED_OUTPATIENT_CLINIC_OR_DEPARTMENT_OTHER): Payer: Medicare Other

## 2013-05-13 ENCOUNTER — Encounter (HOSPITAL_BASED_OUTPATIENT_CLINIC_OR_DEPARTMENT_OTHER): Payer: Self-pay | Admitting: Emergency Medicine

## 2013-05-13 ENCOUNTER — Emergency Department (HOSPITAL_BASED_OUTPATIENT_CLINIC_OR_DEPARTMENT_OTHER)
Admission: EM | Admit: 2013-05-13 | Discharge: 2013-05-14 | Disposition: A | Discharge status: Home / Self Care | Payer: Medicare Other | Attending: Emergency Medicine | Admitting: Emergency Medicine

## 2013-05-13 DIAGNOSIS — Z794 Long term (current) use of insulin: Secondary | ICD-10-CM | POA: Insufficient documentation

## 2013-05-13 DIAGNOSIS — M12869 Other specific arthropathies, not elsewhere classified, unspecified knee: Secondary | ICD-10-CM | POA: Insufficient documentation

## 2013-05-13 DIAGNOSIS — Z8673 Personal history of transient ischemic attack (TIA), and cerebral infarction without residual deficits: Secondary | ICD-10-CM | POA: Insufficient documentation

## 2013-05-13 DIAGNOSIS — I10 Essential (primary) hypertension: Secondary | ICD-10-CM | POA: Insufficient documentation

## 2013-05-13 DIAGNOSIS — K219 Gastro-esophageal reflux disease without esophagitis: Secondary | ICD-10-CM | POA: Insufficient documentation

## 2013-05-13 DIAGNOSIS — R52 Pain, unspecified: Secondary | ICD-10-CM | POA: Insufficient documentation

## 2013-05-13 DIAGNOSIS — Z79899 Other long term (current) drug therapy: Secondary | ICD-10-CM | POA: Insufficient documentation

## 2013-05-13 DIAGNOSIS — Z792 Long term (current) use of antibiotics: Secondary | ICD-10-CM | POA: Insufficient documentation

## 2013-05-13 DIAGNOSIS — B349 Viral infection, unspecified: Secondary | ICD-10-CM

## 2013-05-13 DIAGNOSIS — B9789 Other viral agents as the cause of diseases classified elsewhere: Secondary | ICD-10-CM | POA: Insufficient documentation

## 2013-05-13 DIAGNOSIS — E119 Type 2 diabetes mellitus without complications: Secondary | ICD-10-CM | POA: Insufficient documentation

## 2013-05-13 DIAGNOSIS — Z7982 Long term (current) use of aspirin: Secondary | ICD-10-CM | POA: Insufficient documentation

## 2013-05-13 MED ORDER — ACETAMINOPHEN 325 MG PO TABS
650.0000 mg | ORAL_TABLET | Freq: Once | ORAL | Status: AC
  Administered 2013-05-13: 650 mg via ORAL
  Filled 2013-05-13: qty 2

## 2013-05-13 NOTE — ED Provider Notes (Signed)
CSN: [id number]     Arrival date & time 05/13/13  2031 History  This chart was scribed for Narayan Scull Smitty Cords, MD by Quintella Reichert, ED scribe.  This patient was seen in room MH12/MH12 and the patient's care was started at 12:01 AM.   Chief Complaint  Patient presents with  . Influenza    Patient is a 67 y.o. male presenting with flu symptoms. The history is provided by the patient. No language interpreter was used.  Influenza Presenting symptoms: cough and myalgias   Presenting symptoms: no diarrhea, no fatigue, no fever, no nausea, no rhinorrhea and no shortness of breath   Cough:    Cough characteristics:  Non-productive   Severity:  Moderate   Onset quality:  Gradual   Duration:  3 days   Timing:  Intermittent   Progression:  Unchanged   Chronicity:  New Severity:  Moderate Onset quality:  Gradual Duration:  3 days Progression:  Unchanged Chronicity:  New Relieved by:  Nothing Worsened by:  Nothing tried Ineffective treatments:  None tried Associated symptoms: nasal congestion   Risk factors: being elderly     HPI Comments: Aaron Blevins is a 67 y.o. male who presents to the Emergency Department complaining of 3 days of URI.  Pt states he has had cough, congestion, generalized body aches.      Past Medical History  Diagnosis Date  . Diabetes mellitus   . Hypertension   . GERD (gastroesophageal reflux disease)   . Trouble swallowing   . Arthritis     RIGHT KNEE  . Stroke     JUNE 2013--HAD TERRIBLE H/A--NO DEFICITS   Past Surgical History  Procedure Laterality Date  . Circumcision    . Esophagogastroduodenoscopy (egd) with propofol  04/12/2012    Procedure: ESOPHAGOGASTRODUODENOSCOPY (EGD) WITH PROPOFOL;  Surgeon: Willis Modena, MD;  Location: [facility];  Service: Endoscopy;  Laterality: N/A;  . Balloon dilation  04/12/2012    Procedure: BALLOON DILATION;  Surgeon: Willis Modena, MD;  Location: [facility];  Service: Endoscopy;  Laterality: N/A;    No family history on file. History  Substance Use Topics  . Smoking status: Never Smoker   . Smokeless tobacco: Never Used  . Alcohol Use: No    Review of Systems  Constitutional: Negative for fever and fatigue.  HENT: Positive for congestion. Negative for rhinorrhea.   Respiratory: Positive for cough. Negative for shortness of breath.   Gastrointestinal: Negative for nausea and diarrhea.  Musculoskeletal: Positive for myalgias.  All other systems reviewed and are negative.    Allergies  Review of patient's allergies indicates no known allergies.  Home Medications   Current Outpatient Rx  Name  Route  Sig  Dispense  Refill  . aspirin 81 MG tablet   Oral   Take 81 mg by mouth daily.         Marland Kitchen amoxicillin (AMOXIL) 500 MG capsule   Oral   Take 1 capsule (500 mg total) by mouth 3 (three) times daily.   30 capsule   0   . glipiZIDE (GLUCOTROL) 5 MG tablet   Oral   Take 2.5 mg by mouth every morning. 1/2 TABLET DAILY         . insulin glargine (LANTUS) 100 UNIT/ML injection   Subcutaneous   Inject 10 Units into the skin at bedtime.         Marland Kitchen lisinopril (PRINIVIL,ZESTRIL) 5 MG tablet   Oral   Take 5 mg by mouth every morning.          Marland Kitchen  metFORMIN (GLUCOPHAGE) 500 MG tablet   Oral   Take 1,000 mg by mouth 2 (two) times daily with a meal.         . omeprazole (PRILOSEC) 40 MG capsule   Oral   Take 40 mg by mouth 2 (two) times daily. BEFORE A MEAL         . EXPIRED: pravastatin (PRAVACHOL) 40 MG tablet   Oral   Take 40 mg by mouth every morning.          . sucralfate (CARAFATE) 1 GM/10ML suspension   Oral   Take 1 g by mouth 2 (two) times daily.          BP 161/84  Pulse 79  Temp(Src) 99.3 F (37.4 C) (Oral)  Resp 20  Ht 5\' 7"  (1.702 m)  Wt 180 lb (81.647 kg)  BMI 28.19 kg/m2  SpO2 99%  Physical Exam  Nursing note and vitals reviewed. Constitutional: He is oriented to person, place, and time. He appears well-developed and  well-nourished. No distress.  HENT:  Head: Normocephalic and atraumatic.  Mouth/Throat: Oropharynx is clear and moist and mucous membranes are normal. Mucous membranes are not dry. No oropharyngeal exudate.  Eyes: EOM are normal. Pupils are equal, round, and reactive to light.  Neck: Neck supple. No tracheal deviation present.  Cardiovascular: Normal rate, regular rhythm, normal heart sounds and intact distal pulses.   No murmur heard. Pulmonary/Chest: Effort normal and breath sounds normal. No respiratory distress. He has no wheezes. He has no rales.  Abdominal: Soft. Bowel sounds are normal.  Musculoskeletal: Normal range of motion.  Lymphadenopathy:    He has no cervical adenopathy.  Neurological: He is alert and oriented to person, place, and time.  Skin: Skin is warm and dry.  Psychiatric: He has a normal mood and affect. His behavior is normal.    ED Course  Procedures (including critical care time)  DIAGNOSTIC STUDIES: Oxygen Saturation is 99% on room air, normal by my interpretation.    COORDINATION OF CARE: 12:03 AM: Informed pt that symptoms are likely due to influenza and will resolve on their own. Discussed treatment plan which includes symptomatic relief.  Pt expressed understanding and agreed to plan.    Labs Review Labs Reviewed - No data to display  Imaging Review Dg Chest 2 View  05/13/2013   CLINICAL DATA:  Cough and congestion  EXAM: CHEST  2 VIEW  COMPARISON:  April 04, 2012  FINDINGS: There is no edema or consolidation. The heart size and pulmonary vascularity are normal. No adenopathy. There is degenerative change in the thoracic spine. There is mild anterior wedging of several mid thoracic vertebral bodies.  IMPRESSION: No edema or consolidation.   Electronically Signed   By: Bretta Bang M.D.   On: 05/13/2013 21:18    EKG Interpretation   None       MDM  No diagnosis found. Alternate tylenol and ibuprofen and lots of liquids.     I  personally performed the services described in this documentation, which was scribed in my presence. The recorded information has been reviewed and is accurate.     Jasmine Awe, MD 05/14/13 [phone]

## 2013-05-13 NOTE — ED Notes (Signed)
Patient started having flu symptoms 12/25. Cough, congestion, generalized body aches

## 2013-05-14 MED ORDER — KETOROLAC TROMETHAMINE 60 MG/2ML IM SOLN
60.0000 mg | Freq: Once | INTRAMUSCULAR | Status: AC
  Administered 2013-05-14: 60 mg via INTRAMUSCULAR
  Filled 2013-05-14: qty 2

## 2013-05-14 MED ORDER — IBUPROFEN 600 MG PO TABS: 600.0000 mg | ORAL_TABLET | Freq: Four times a day (QID) | ORAL | Status: DC | PRN

## 2013-06-01 ENCOUNTER — Emergency Department (HOSPITAL_BASED_OUTPATIENT_CLINIC_OR_DEPARTMENT_OTHER): Payer: Medicare Other

## 2013-06-01 ENCOUNTER — Encounter (HOSPITAL_BASED_OUTPATIENT_CLINIC_OR_DEPARTMENT_OTHER): Payer: Self-pay | Admitting: Emergency Medicine

## 2013-06-01 ENCOUNTER — Emergency Department (HOSPITAL_BASED_OUTPATIENT_CLINIC_OR_DEPARTMENT_OTHER)
Admission: EM | Admit: 2013-06-01 | Discharge: 2013-06-02 | Disposition: A | Discharge status: Home / Self Care | Payer: Medicare Other | Attending: Emergency Medicine | Admitting: Emergency Medicine

## 2013-06-01 DIAGNOSIS — E119 Type 2 diabetes mellitus without complications: Secondary | ICD-10-CM | POA: Insufficient documentation

## 2013-06-01 DIAGNOSIS — I10 Essential (primary) hypertension: Secondary | ICD-10-CM | POA: Insufficient documentation

## 2013-06-01 DIAGNOSIS — Z8673 Personal history of transient ischemic attack (TIA), and cerebral infarction without residual deficits: Secondary | ICD-10-CM | POA: Insufficient documentation

## 2013-06-01 DIAGNOSIS — K219 Gastro-esophageal reflux disease without esophagitis: Secondary | ICD-10-CM | POA: Insufficient documentation

## 2013-06-01 DIAGNOSIS — Z79899 Other long term (current) drug therapy: Secondary | ICD-10-CM | POA: Insufficient documentation

## 2013-06-01 DIAGNOSIS — Z794 Long term (current) use of insulin: Secondary | ICD-10-CM | POA: Insufficient documentation

## 2013-06-01 DIAGNOSIS — M25579 Pain in unspecified ankle and joints of unspecified foot: Secondary | ICD-10-CM | POA: Insufficient documentation

## 2013-06-01 DIAGNOSIS — M1710 Unilateral primary osteoarthritis, unspecified knee: Secondary | ICD-10-CM | POA: Insufficient documentation

## 2013-06-01 DIAGNOSIS — M79673 Pain in unspecified foot: Secondary | ICD-10-CM

## 2013-06-01 DIAGNOSIS — Z7982 Long term (current) use of aspirin: Secondary | ICD-10-CM | POA: Insufficient documentation

## 2013-06-01 DIAGNOSIS — Z792 Long term (current) use of antibiotics: Secondary | ICD-10-CM | POA: Insufficient documentation

## 2013-06-01 DIAGNOSIS — IMO0002 Reserved for concepts with insufficient information to code with codable children: Secondary | ICD-10-CM

## 2013-06-01 MED ORDER — TRAMADOL HCL 50 MG PO TABS
50.0000 mg | ORAL_TABLET | Freq: Once | ORAL | Status: AC
  Administered 2013-06-01: 50 mg via ORAL
  Filled 2013-06-01: qty 1

## 2013-06-01 NOTE — ED Notes (Signed)
Pain in his left heel x 4 days. No known injury.

## 2013-06-01 NOTE — ED Provider Notes (Signed)
CSN: [id number]     Arrival date & time 06/01/13  2249 History  This chart was scribed for Anisia Leija Smitty Cords, MD by Blanchard Kelch, ED Scribe. The patient was seen in room MH09/MH09. Patient's care was started at 11:24 PM.    Chief Complaint  Patient presents with  . Foot Pain    Patient is a 68 y.o. male presenting with lower extremity pain. The history is provided by the patient. No language interpreter was used.  Foot Pain This is a new problem. The current episode started more than 2 days ago. The problem occurs constantly. The problem has not changed since onset.Pertinent negatives include no shortness of breath. Nothing aggravates the symptoms. Nothing relieves the symptoms. He has tried nothing for the symptoms. The treatment provided no relief.    HPI Comments: Aaron Blevins is a 68 y.o. male who presents to the Emergency Department complaining of constant left heel pain that began three days ago. He describes the pain as aching. The pain is worsened by walking. He denies any acute injury or twisting to the area. He denies taking any medication for the pain. He has a past medical history of diabetes.   Past Medical History  Diagnosis Date  . Diabetes mellitus   . Hypertension   . GERD (gastroesophageal reflux disease)   . Trouble swallowing   . Arthritis     RIGHT KNEE  . Stroke     JUNE 2013--HAD TERRIBLE H/A--NO DEFICITS   Past Surgical History  Procedure Laterality Date  . Circumcision    . Esophagogastroduodenoscopy (egd) with propofol  04/12/2012    Procedure: ESOPHAGOGASTRODUODENOSCOPY (EGD) WITH PROPOFOL;  Surgeon: Willis Modena, MD;  Location: [facility];  Service: Endoscopy;  Laterality: N/A;  . Balloon dilation  04/12/2012    Procedure: BALLOON DILATION;  Surgeon: Willis Modena, MD;  Location: [facility];  Service: Endoscopy;  Laterality: N/A;   No family history on file. History  Substance Use Topics  . Smoking status: Never Smoker   . Smokeless  tobacco: Never Used  . Alcohol Use: No    Review of Systems  Respiratory: Negative for shortness of breath.   Musculoskeletal: Positive for arthralgias and gait problem. Negative for joint swelling.  All other systems reviewed and are negative.    Allergies  Review of patient's allergies indicates no known allergies.  Home Medications   Current Outpatient Rx  Name  Route  Sig  Dispense  Refill  . amoxicillin (AMOXIL) 500 MG capsule   Oral   Take 1 capsule (500 mg total) by mouth 3 (three) times daily.   30 capsule   0   . aspirin 81 MG tablet   Oral   Take 81 mg by mouth daily.         Marland Kitchen glipiZIDE (GLUCOTROL) 5 MG tablet   Oral   Take 2.5 mg by mouth every morning. 1/2 TABLET DAILY         . ibuprofen (ADVIL,MOTRIN) 600 MG tablet   Oral   Take 1 tablet (600 mg total) by mouth every 6 (six) hours as needed.   30 tablet   0   . insulin glargine (LANTUS) 100 UNIT/ML injection   Subcutaneous   Inject 10 Units into the skin at bedtime.         Marland Kitchen lisinopril (PRINIVIL,ZESTRIL) 5 MG tablet   Oral   Take 5 mg by mouth every morning.          . metFORMIN (GLUCOPHAGE) 500  MG tablet   Oral   Take 1,000 mg by mouth 2 (two) times daily with a meal.         . omeprazole (PRILOSEC) 40 MG capsule   Oral   Take 40 mg by mouth 2 (two) times daily. BEFORE A MEAL         . EXPIRED: pravastatin (PRAVACHOL) 40 MG tablet   Oral   Take 40 mg by mouth every morning.          . sucralfate (CARAFATE) 1 GM/10ML suspension   Oral   Take 1 g by mouth 2 (two) times daily.          Triage Vitals: BP 174/96  Pulse 80  Temp(Src) 99 F (37.2 C) (Oral)  Resp 18  Ht 5\' 6"  (1.676 m)  Wt 180 lb (81.647 kg)  BMI 29.07 kg/m2  SpO2 98%  Physical Exam  Nursing note and vitals reviewed. Constitutional: He is oriented to person, place, and time. He appears well-developed and well-nourished. No distress.  HENT:  Head: Normocephalic and atraumatic.  Mouth/Throat:  Oropharynx is clear and moist. No oropharyngeal exudate.  Eyes: EOM are normal.  Neck: Normal range of motion. Neck supple.  Cardiovascular: Normal rate and regular rhythm.   Pulses:      Dorsalis pedis pulses are 3+ on the left side.  3+ DP. Cap refill < 2 seconds.  Pulmonary/Chest: Effort normal. He has no wheezes. He has no rales.  Abdominal: Soft. Bowel sounds are normal. There is no rebound and no guarding.  Musculoskeletal: Normal range of motion. He exhibits no edema and no tenderness.  NVI. Achilles intact left. No warmth, erythema, crepitus, no swelling, no fluctuance or deformity of left foot. No lesions or ulcerations.  Neurological: He is alert and oriented to person, place, and time. He has normal reflexes.  Skin: Skin is warm and dry. No erythema.  Psychiatric: He has a normal mood and affect.    ED Course  Procedures (including critical care time)  DIAGNOSTIC STUDIES: Oxygen Saturation is 98% on room air, normal by my interpretation.    COORDINATION OF CARE: 11:28 PM -X-ray looks normal. Recommend OTC medication and elevation to relieve pain. Patient verbalizes understanding and agrees with treatment plan.    Labs Review Labs Reviewed - No data to display Imaging Review No results found.  EKG Interpretation   None       MDM  No diagnosis found. No warmth or swelling of any part of the foot ice elevate and take pain medication  I personally performed the services described in this documentation, which was scribed in my presence. The recorded information has been reviewed and is accurate.    Jasmine AweApril K Karrina Lye-Rasch, MD 06/02/13 [phone]

## 2013-06-02 MED ORDER — MELOXICAM 7.5 MG PO TABS: 7.5000 mg | ORAL_TABLET | Freq: Every day | ORAL | Status: DC

## 2014-01-11 ENCOUNTER — Emergency Department (HOSPITAL_BASED_OUTPATIENT_CLINIC_OR_DEPARTMENT_OTHER): Payer: Medicare Other

## 2014-01-11 ENCOUNTER — Emergency Department (HOSPITAL_BASED_OUTPATIENT_CLINIC_OR_DEPARTMENT_OTHER)
Admission: EM | Admit: 2014-01-11 | Discharge: 2014-01-12 | Disposition: A | Discharge status: Home / Self Care | Payer: Medicare Other | Attending: Emergency Medicine | Admitting: Emergency Medicine

## 2014-01-11 ENCOUNTER — Encounter (HOSPITAL_BASED_OUTPATIENT_CLINIC_OR_DEPARTMENT_OTHER): Payer: Self-pay | Admitting: Emergency Medicine

## 2014-01-11 DIAGNOSIS — Z8673 Personal history of transient ischemic attack (TIA), and cerebral infarction without residual deficits: Secondary | ICD-10-CM | POA: Diagnosis not present

## 2014-01-11 DIAGNOSIS — S0990XA Unspecified injury of head, initial encounter: Secondary | ICD-10-CM | POA: Insufficient documentation

## 2014-01-11 DIAGNOSIS — Z79899 Other long term (current) drug therapy: Secondary | ICD-10-CM | POA: Diagnosis not present

## 2014-01-11 DIAGNOSIS — Z791 Long term (current) use of non-steroidal anti-inflammatories (NSAID): Secondary | ICD-10-CM | POA: Insufficient documentation

## 2014-01-11 DIAGNOSIS — Y929 Unspecified place or not applicable: Secondary | ICD-10-CM | POA: Insufficient documentation

## 2014-01-11 DIAGNOSIS — Z794 Long term (current) use of insulin: Secondary | ICD-10-CM | POA: Insufficient documentation

## 2014-01-11 DIAGNOSIS — Y9389 Activity, other specified: Secondary | ICD-10-CM | POA: Diagnosis not present

## 2014-01-11 DIAGNOSIS — K219 Gastro-esophageal reflux disease without esophagitis: Secondary | ICD-10-CM | POA: Insufficient documentation

## 2014-01-11 DIAGNOSIS — Z792 Long term (current) use of antibiotics: Secondary | ICD-10-CM | POA: Insufficient documentation

## 2014-01-11 DIAGNOSIS — Z7982 Long term (current) use of aspirin: Secondary | ICD-10-CM | POA: Insufficient documentation

## 2014-01-11 DIAGNOSIS — I10 Essential (primary) hypertension: Secondary | ICD-10-CM | POA: Diagnosis not present

## 2014-01-11 DIAGNOSIS — E119 Type 2 diabetes mellitus without complications: Secondary | ICD-10-CM | POA: Insufficient documentation

## 2014-01-11 DIAGNOSIS — M129 Arthropathy, unspecified: Secondary | ICD-10-CM | POA: Diagnosis not present

## 2014-01-11 DIAGNOSIS — IMO0002 Reserved for concepts with insufficient information to code with codable children: Secondary | ICD-10-CM | POA: Insufficient documentation

## 2014-01-11 NOTE — ED Notes (Signed)
Headache x 2 days since hitting the top of his head on the trunk of his car. No LOC.

## 2014-01-11 NOTE — ED Provider Notes (Signed)
CSN: [id number]     Arrival date & time 01/11/14  2215 History  This chart was scribed for Hanley Seamen, MD by Bronson Curb, ED Scribe. This patient was seen in room MH07/MH07 and the patient's care was started at 11:44 PM.    Chief Complaint  Patient presents with  . Headache     The history is provided by the patient. No language interpreter was used.    HPI Comments: Aaron Blevins is a 68 y.o. male who presents to the Emergency Department complaining of HA onset 2 days ago after striking the top of his head on the trunk of his car. He denies LOC. There is associated light headedness and neck pain. Patient reports taking a "pain pill" with little improvement. He denies nausea, vomiting, or trouble thinking, talking, or moving.   Past Medical History  Diagnosis Date  . Diabetes mellitus   . Hypertension   . GERD (gastroesophageal reflux disease)   . Trouble swallowing   . Arthritis     RIGHT KNEE  . Stroke     JUNE 2013--HAD TERRIBLE H/A--NO DEFICITS   Past Surgical History  Procedure Laterality Date  . Circumcision    . Esophagogastroduodenoscopy (egd) with propofol  04/12/2012    Procedure: ESOPHAGOGASTRODUODENOSCOPY (EGD) WITH PROPOFOL;  Surgeon: Willis Modena, MD;  Location: [facility];  Service: Endoscopy;  Laterality: N/A;  . Balloon dilation  04/12/2012    Procedure: BALLOON DILATION;  Surgeon: Willis Modena, MD;  Location: [facility];  Service: Endoscopy;  Laterality: N/A;   No family history on file. History  Substance Use Topics  . Smoking status: Never Smoker   . Smokeless tobacco: Never Used  . Alcohol Use: No    Review of Systems A complete 10 system review of systems was obtained and all systems are negative except as noted in the HPI and PMH.     Allergies  Review of patient's allergies indicates no known allergies.  Home Medications   Prior to Admission medications   Medication Sig Start Date End Date Taking? Authorizing Provider   amoxicillin (AMOXIL) 500 MG capsule Take 1 capsule (500 mg total) by mouth 3 (three) times daily. 02/15/13   Teressa Lower, NP  aspirin 81 MG tablet Take 81 mg by mouth daily.    Historical Provider, MD  glipiZIDE (GLUCOTROL) 5 MG tablet Take 2.5 mg by mouth every morning. 1/2 TABLET DAILY    Historical Provider, MD  ibuprofen (ADVIL,MOTRIN) 600 MG tablet Take 1 tablet (600 mg total) by mouth every 6 (six) hours as needed. 05/14/13   April K Palumbo-Rasch, MD  insulin glargine (LANTUS) 100 UNIT/ML injection Inject 10 Units into the skin at bedtime.    Historical Provider, MD  lisinopril (PRINIVIL,ZESTRIL) 5 MG tablet Take 5 mg by mouth every morning.     Historical Provider, MD  meloxicam (MOBIC) 7.5 MG tablet Take 1 tablet (7.5 mg total) by mouth daily. 06/02/13   April K Palumbo-Rasch, MD  metFORMIN (GLUCOPHAGE) 500 MG tablet Take 1,000 mg by mouth 2 (two) times daily with a meal.    Historical Provider, MD  omeprazole (PRILOSEC) 40 MG capsule Take 40 mg by mouth 2 (two) times daily. BEFORE A MEAL    Historical Provider, MD  pravastatin (PRAVACHOL) 40 MG tablet Take 40 mg by mouth every morning.  10/20/11 10/19/12  Vassie Loll, MD  sucralfate (CARAFATE) 1 GM/10ML suspension Take 1 g by mouth 2 (two) times daily. 04/04/12   April Smitty Cords, MD  Triage Vitals: BP 153/98  Pulse 67  Temp(Src) 98.2 F (36.8 C) (Oral)  Resp 18  Ht  (1.702 m)  Wt 180 lb (81.647 kg)  BMI 28.19 kg/m2  SpO2 99%  Physical Exam  Nursing note and vitals reviewed. General: Well-developed, well-nourished male in no acute distress; appearance consistent with age of record HENT: normocephalic; tenderness along the right parietal region, no step off or crepitus; no hemotympanum Eyes: pupils equal, round and reactive to light; extraocular muscles intact Neck: supple; mild C-spine tenderness Heart: regular rate and rhythm Lungs: clear to auscultation bilaterally Abdomen: soft; nondistended; nontender; no  masses or hepatosplenomegaly; bowel sounds present Extremities: No deformity; full range of motion; pulses normal. . Neurologic: Awake, alert and oriented; motor function intact in all extremities and symmetric; no facial droop Skin: Warm and dry Psychiatric: Normal mood and affect   ED Course  Procedures (including critical care time)  DIAGNOSTIC STUDIES: Oxygen Saturation is 99% on room air, normal by my interpretation.    COORDINATION OF CARE: At 2349 Discussed treatment plan with patient which includes imaging. Patient agrees.    MDM  Nursing notes and vitals signs, including pulse oximetry, reviewed.  Summary of this visit's results, reviewed by myself:  Labs:  No results found for this or any previous visit (from the past 24 hour(s)).  Imaging Studies: Dg Cervical Spine Complete  01/12/2014   CLINICAL DATA:  Injury to head with headache and woozy feeling.  EXAM: CERVICAL SPINE  4+ VIEWS  COMPARISON:  04/04/2012  FINDINGS: Normal alignment of the cervical spine. Degenerative changes with narrowed cervical interspaces and prominent endplate hypertrophic changes and bone sclerosis at C5-6 and C6-7 levels. This is stable since previous study. No prevertebral soft tissue swelling. Degenerative changes throughout the facet joints. Suggestion of mild bony encroachment upon neural foramina at C5-6 and C6-7 bilaterally, greater on the left. C1-2 articulation appears intact. No focal bone destruction.  IMPRESSION: Prominent degenerative changes in the cervical spine stable since previous study. No displaced fractures identified.   Electronically Signed   By: Burman Nieves M.D.   On: 01/12/2014 00:59   Ct Head Wo Contrast  01/12/2014   CLINICAL DATA:  Trauma to head.  EXAM: CT HEAD WITHOUT CONTRAST  TECHNIQUE: Contiguous axial images were obtained from the base of the skull through the vertex without intravenous contrast.  COMPARISON:  CT head without contrast 04/04/2012. MRI brain  01/12/2012  FINDINGS: Extensive periventricular and subcortical white matter hypoattenuation is noted bilaterally. Ventricles are of normal size scratch the the ventricles are proportionate to the degree of atrophy without significant change.  No acute infarct, hemorrhage, or mass lesion is present. No significant extraaxial fluid collection is present.  No significant extracranial soft tissue injury is present. Paranasal sinuses and mastoid air cells are clear. The osseous skull is intact.  IMPRESSION: 1. Stable diffuse white matter disease. 2. No acute intracranial abnormality or significant interval change. 3. No evidence for acute trauma.   Electronically Signed   By: Gennette Pac M.D.   On: 01/12/2014 00:57    I personally performed the services described in this documentation, which was scribed in my presence. The recorded information has been reviewed and is accurate.   Hanley Seamen, MD 01/12/14 [phone]

## 2014-01-11 NOTE — ED Notes (Signed)
Pt reports that he hit head on top left while opening his trunk, he denied loc, denies blood thinners, states that he does take asa daily, pt reports he did take a "pain pill" for pain at home with little improvement, denies n/v, dizziness, but states he does feel "whoozy" since event. Currently neuro intact, wife at bedside

## 2014-01-12 ENCOUNTER — Emergency Department (HOSPITAL_BASED_OUTPATIENT_CLINIC_OR_DEPARTMENT_OTHER): Payer: Medicare Other

## 2014-01-12 MED ORDER — HYDROCODONE-ACETAMINOPHEN 5-325 MG PO TABS: 1.0000 | ORAL_TABLET | Freq: Four times a day (QID) | ORAL | Status: DC | PRN

## 2014-01-12 MED ORDER — HYDROCODONE-ACETAMINOPHEN 5-325 MG PO TABS
1.0000 | ORAL_TABLET | Freq: Once | ORAL | Status: AC
  Administered 2014-01-12: 1 via ORAL
  Filled 2014-01-12: qty 1

## 2014-01-28 ENCOUNTER — Emergency Department (HOSPITAL_BASED_OUTPATIENT_CLINIC_OR_DEPARTMENT_OTHER)
Admission: EM | Admit: 2014-01-28 | Discharge: 2014-01-28 | Disposition: A | Discharge status: Home / Self Care | Payer: Medicare Other | Attending: Emergency Medicine | Admitting: Emergency Medicine

## 2014-01-28 ENCOUNTER — Encounter (HOSPITAL_BASED_OUTPATIENT_CLINIC_OR_DEPARTMENT_OTHER): Payer: Self-pay | Admitting: Emergency Medicine

## 2014-01-28 DIAGNOSIS — Y9389 Activity, other specified: Secondary | ICD-10-CM | POA: Insufficient documentation

## 2014-01-28 DIAGNOSIS — Z79899 Other long term (current) drug therapy: Secondary | ICD-10-CM | POA: Insufficient documentation

## 2014-01-28 DIAGNOSIS — I10 Essential (primary) hypertension: Secondary | ICD-10-CM | POA: Insufficient documentation

## 2014-01-28 DIAGNOSIS — Z791 Long term (current) use of non-steroidal anti-inflammatories (NSAID): Secondary | ICD-10-CM | POA: Insufficient documentation

## 2014-01-28 DIAGNOSIS — K219 Gastro-esophageal reflux disease without esophagitis: Secondary | ICD-10-CM | POA: Diagnosis not present

## 2014-01-28 DIAGNOSIS — Z794 Long term (current) use of insulin: Secondary | ICD-10-CM | POA: Diagnosis not present

## 2014-01-28 DIAGNOSIS — Z7982 Long term (current) use of aspirin: Secondary | ICD-10-CM | POA: Insufficient documentation

## 2014-01-28 DIAGNOSIS — T6391XA Toxic effect of contact with unspecified venomous animal, accidental (unintentional), initial encounter: Secondary | ICD-10-CM | POA: Diagnosis not present

## 2014-01-28 DIAGNOSIS — T63461A Toxic effect of venom of wasps, accidental (unintentional), initial encounter: Secondary | ICD-10-CM | POA: Diagnosis not present

## 2014-01-28 DIAGNOSIS — M1710 Unilateral primary osteoarthritis, unspecified knee: Secondary | ICD-10-CM | POA: Diagnosis not present

## 2014-01-28 DIAGNOSIS — IMO0002 Reserved for concepts with insufficient information to code with codable children: Secondary | ICD-10-CM

## 2014-01-28 DIAGNOSIS — Z8673 Personal history of transient ischemic attack (TIA), and cerebral infarction without residual deficits: Secondary | ICD-10-CM | POA: Insufficient documentation

## 2014-01-28 DIAGNOSIS — E119 Type 2 diabetes mellitus without complications: Secondary | ICD-10-CM | POA: Diagnosis not present

## 2014-01-28 DIAGNOSIS — Y9289 Other specified places as the place of occurrence of the external cause: Secondary | ICD-10-CM | POA: Diagnosis not present

## 2014-01-28 DIAGNOSIS — T63441S Toxic effect of venom of bees, accidental (unintentional), sequela: Secondary | ICD-10-CM

## 2014-01-28 MED ORDER — ONDANSETRON 4 MG PO TBDP
4.0000 mg | ORAL_TABLET | Freq: Once | ORAL | Status: AC
  Administered 2014-01-28: 4 mg via ORAL
  Filled 2014-01-28: qty 1

## 2014-01-28 MED ORDER — OXYCODONE-ACETAMINOPHEN 5-325 MG PO TABS: 1.0000 | ORAL_TABLET | Freq: Once | ORAL | Status: DC

## 2014-01-28 MED ORDER — DIPHENHYDRAMINE HCL 50 MG PO CAPS: 50.0000 mg | ORAL_CAPSULE | Freq: Once | ORAL | Status: DC

## 2014-01-28 MED ORDER — ONDANSETRON 4 MG PO TBDP: 4.0000 mg | ORAL_TABLET | Freq: Once | ORAL | Status: DC

## 2014-01-28 MED ORDER — DIPHENHYDRAMINE HCL 25 MG PO CAPS
50.0000 mg | ORAL_CAPSULE | Freq: Once | ORAL | Status: AC
  Administered 2014-01-28: 50 mg via ORAL
  Filled 2014-01-28: qty 2

## 2014-01-28 MED ORDER — OXYCODONE-ACETAMINOPHEN 5-325 MG PO TABS
1.0000 | ORAL_TABLET | Freq: Once | ORAL | Status: AC
  Administered 2014-01-28: 1 via ORAL
  Filled 2014-01-28: qty 1

## 2014-01-28 NOTE — ED Notes (Signed)
Crackers and juice offered

## 2014-01-28 NOTE — ED Provider Notes (Signed)
CSN: [id number]     Arrival date & time 01/28/14  1753 History   This chart was scribed for Aaron Chick, MD, by Yevette Edwards, ED Scribe. This patient was seen in room MH03/MH03 and the patient's care was started at 7:11 PM.  First MD Initiated Contact with Patient 01/28/14 1816     Chief Complaint  Patient presents with  . Insect Bite    Patient is a 68 y.o. male presenting with allergic reaction. The history is provided by the patient and a relative. No language interpreter was used.  Allergic Reaction Presenting symptoms: no difficulty breathing and no wheezing   Severity:  Mild Prior allergic episodes:  No prior episodes Context: insect bite/sting   Relieved by:  None tried Worsened by:  Nothing tried Ineffective treatments:  None tried  HPI Comments: Aaron Blevins is a 68 y.o. male who presents to the Emergency Department complaining of multiple stings, possibly from yellow jackets, which occurred approximately 90 minutes ago to his arms bilaterally. He endorses "achiness" associated with the sites. His family reports he acted drowsy after the event. He denies SOB, oropharynx swelling, or facial swelling. [name] denies a h/o severe reactions to bee stings.   Past Medical History  Diagnosis Date  . Diabetes mellitus   . Hypertension   . GERD (gastroesophageal reflux disease)   . Trouble swallowing   . Arthritis     RIGHT KNEE  . Stroke     JUNE 2013--HAD TERRIBLE H/A--NO DEFICITS   Past Surgical History  Procedure Laterality Date  . Circumcision    . Esophagogastroduodenoscopy (egd) with propofol  04/12/2012    Procedure: ESOPHAGOGASTRODUODENOSCOPY (EGD) WITH PROPOFOL;  Surgeon: Willis Modena, MD;  Location: [facility];  Service: Endoscopy;  Laterality: N/A;  . Balloon dilation  04/12/2012    Procedure: BALLOON DILATION;  Surgeon: Willis Modena, MD;  Location: [facility];  Service: Endoscopy;  Laterality: N/A;   No family history on file. History  Substance  Use Topics  . Smoking status: Never Smoker   . Smokeless tobacco: Never Used  . Alcohol Use: No    Review of Systems  HENT: Negative for facial swelling.   Respiratory: Negative for wheezing.   Musculoskeletal: Positive for myalgias.  All other systems reviewed and are negative.   Allergies  Review of patient's allergies indicates no known allergies.  Home Medications   Prior to Admission medications   Medication Sig Start Date End Date Taking? Authorizing Provider  LOSARTAN POTASSIUM-HCTZ PO Take by mouth.   Yes Historical Provider, MD  amoxicillin (AMOXIL) 500 MG capsule Take 1 capsule (500 mg total) by mouth 3 (three) times daily. 02/15/13   Teressa Lower, NP  aspirin 81 MG tablet Take 81 mg by mouth daily.    Historical Provider, MD  diphenhydrAMINE (BENADRYL) 50 MG capsule Take 1 capsule (50 mg total) by mouth once. 01/28/14   Aaron Chick, MD  glipiZIDE (GLUCOTROL) 5 MG tablet Take 2.5 mg by mouth every morning. 1/2 TABLET DAILY    Historical Provider, MD  HYDROcodone-acetaminophen (NORCO) 5-325 MG per tablet Take 1-2 tablets by mouth every 6 (six) hours as needed (for pain). 01/12/14   Carlisle Beers Molpus, MD  ibuprofen (ADVIL,MOTRIN) 600 MG tablet Take 1 tablet (600 mg total) by mouth every 6 (six) hours as needed. 05/14/13   April K Palumbo-Rasch, MD  insulin glargine (LANTUS) 100 UNIT/ML injection Inject 10 Units into the skin at bedtime.    Historical Provider, MD  lisinopril (  PRINIVIL,ZESTRIL) 5 MG tablet Take 5 mg by mouth every morning.     Historical Provider, MD  meloxicam (MOBIC) 7.5 MG tablet Take 1 tablet (7.5 mg total) by mouth daily. 06/02/13   April K Palumbo-Rasch, MD  metFORMIN (GLUCOPHAGE) 500 MG tablet Take 1,000 mg by mouth 2 (two) times daily with a meal.    Historical Provider, MD  omeprazole (PRILOSEC) 40 MG capsule Take 40 mg by mouth 2 (two) times daily. BEFORE A MEAL    Historical Provider, MD  ondansetron (ZOFRAN-ODT) 4 MG disintegrating tablet Take 1  tablet (4 mg total) by mouth once. 01/28/14   Aaron Chick, MD  oxyCODONE-acetaminophen (PERCOCET/ROXICET) 5-325 MG per tablet Take 1 tablet by mouth once. 01/28/14   Aaron Chick, MD  pravastatin (PRAVACHOL) 40 MG tablet Take 40 mg by mouth every morning.  10/20/11 10/19/12  Vassie Loll, MD  sucralfate (CARAFATE) 1 GM/10ML suspension Take 1 g by mouth 2 (two) times daily. 04/04/12   April K Palumbo-Rasch, MD   Triage Vitals: BP 169/100  Pulse 74  Temp(Src) 98.1 F (36.7 C) (Oral)  Resp 18  SpO2 100%  Physical Exam  Nursing note and vitals reviewed. Constitutional: He is oriented to person, place, and time. He appears well-developed and well-nourished. No distress.  HENT:  Head: Normocephalic and atraumatic.  Eyes: Conjunctivae and EOM are normal.  Neck: Neck supple. No tracheal deviation present.  Cardiovascular: Normal rate.   Pulmonary/Chest: Effort normal. No respiratory distress.  Musculoskeletal: Normal range of motion.  Neurological: He is alert and oriented to person, place, and time.  Skin: Skin is warm and dry.  Psychiatric: He has a normal mood and affect. His behavior is normal.  Note- scattered erythematous papules on bilateral forearms c/w areas of bites, no hives, lungs CTA, no increased respiratory effort or wheezing, OP clear, no lip or tongue swelling, uvula midline, brisk cap refill  ED Course  Procedures (including critical care time)  DIAGNOSTIC STUDIES: Oxygen Saturation is 100% on room air, normal by my interpretation.    COORDINATION OF CARE:  7:16 PM- Discussed treatment plan with patient, and the patient agreed to the plan. Encouraged pt to take Benadryl and OTC pain medication every six hours for the next few days to address the symptoms.   Labs Review Labs Reviewed - No data to display  Imaging Review No results found.   EKG Interpretation None      MDM   Final diagnoses:  Local reaction to bee sting, accidental or unintentional,  sequela    Pt presenting with c/o multiple bee stings.  No signs or symptoms of anaphylaxis.  Pt treated with benadryl and pain meds in the ED.  Discharged with strict return precautions.  Pt agreeable with plan.  I personally performed the services described in this documentation, which was scribed in my presence. The recorded information has been reviewed and is accurate.    Aaron Chick, MD 01/28/14 [phone]

## 2014-01-28 NOTE — ED Notes (Signed)
Bee sting x 5 30 minutes ago. No respiratory distress.

## 2014-01-28 NOTE — Discharge Instructions (Signed)
Return to the ED with any concerns including difficulty breathing or swallowing, lip or tongue swelling, vomiting and not able to keep down liquids, decreased level of alertness/lethargy, or any other alarming symptoms

## 2014-02-26 ENCOUNTER — Emergency Department (HOSPITAL_BASED_OUTPATIENT_CLINIC_OR_DEPARTMENT_OTHER): Payer: Medicare Other

## 2014-02-26 ENCOUNTER — Emergency Department (HOSPITAL_BASED_OUTPATIENT_CLINIC_OR_DEPARTMENT_OTHER)
Admission: EM | Admit: 2014-02-26 | Discharge: 2014-02-26 | Disposition: A | Discharge status: Home / Self Care | Payer: Medicare Other | Attending: Emergency Medicine | Admitting: Emergency Medicine

## 2014-02-26 ENCOUNTER — Encounter (HOSPITAL_BASED_OUTPATIENT_CLINIC_OR_DEPARTMENT_OTHER): Payer: Self-pay | Admitting: Emergency Medicine

## 2014-02-26 DIAGNOSIS — R300 Dysuria: Secondary | ICD-10-CM | POA: Diagnosis not present

## 2014-02-26 DIAGNOSIS — M545 Low back pain, unspecified: Secondary | ICD-10-CM

## 2014-02-26 DIAGNOSIS — M179 Osteoarthritis of knee, unspecified: Secondary | ICD-10-CM | POA: Insufficient documentation

## 2014-02-26 DIAGNOSIS — Z7982 Long term (current) use of aspirin: Secondary | ICD-10-CM | POA: Insufficient documentation

## 2014-02-26 DIAGNOSIS — R51 Headache: Secondary | ICD-10-CM | POA: Insufficient documentation

## 2014-02-26 DIAGNOSIS — E119 Type 2 diabetes mellitus without complications: Secondary | ICD-10-CM | POA: Diagnosis not present

## 2014-02-26 DIAGNOSIS — Z794 Long term (current) use of insulin: Secondary | ICD-10-CM | POA: Diagnosis not present

## 2014-02-26 DIAGNOSIS — I10 Essential (primary) hypertension: Secondary | ICD-10-CM | POA: Diagnosis not present

## 2014-02-26 DIAGNOSIS — K219 Gastro-esophageal reflux disease without esophagitis: Secondary | ICD-10-CM | POA: Insufficient documentation

## 2014-02-26 DIAGNOSIS — Z792 Long term (current) use of antibiotics: Secondary | ICD-10-CM | POA: Diagnosis not present

## 2014-02-26 DIAGNOSIS — Z8673 Personal history of transient ischemic attack (TIA), and cerebral infarction without residual deficits: Secondary | ICD-10-CM | POA: Insufficient documentation

## 2014-02-26 DIAGNOSIS — Z791 Long term (current) use of non-steroidal anti-inflammatories (NSAID): Secondary | ICD-10-CM | POA: Insufficient documentation

## 2014-02-26 DIAGNOSIS — Z79899 Other long term (current) drug therapy: Secondary | ICD-10-CM | POA: Insufficient documentation

## 2014-02-26 DIAGNOSIS — Z9889 Other specified postprocedural states: Secondary | ICD-10-CM | POA: Diagnosis not present

## 2014-02-26 DIAGNOSIS — M549 Dorsalgia, unspecified: Secondary | ICD-10-CM

## 2014-02-26 LAB — CBC WITH DIFFERENTIAL/PLATELET
Basophils Absolute: 0.1 10*3/uL (ref 0.0–0.1)
Basophils Relative: 1 % (ref 0–1)
Eosinophils Absolute: 0.3 10*3/uL (ref 0.0–0.7)
Eosinophils Relative: 4 % (ref 0–5)
HCT: 35.6 % — ABNORMAL LOW (ref 39.0–52.0)
Hemoglobin: 12.2 g/dL — ABNORMAL LOW (ref 13.0–17.0)
Lymphocytes Relative: 30 % (ref 12–46)
Lymphs Abs: 2.2 10*3/uL (ref 0.7–4.0)
MCH: 29.8 pg (ref 26.0–34.0)
MCHC: 34.3 g/dL (ref 30.0–36.0)
MCV: 86.8 fL (ref 78.0–100.0)
Monocytes Absolute: 0.8 10*3/uL (ref 0.1–1.0)
Monocytes Relative: 11 % (ref 3–12)
Neutro Abs: 3.9 10*3/uL (ref 1.7–7.7)
Neutrophils Relative %: 54 % (ref 43–77)
Platelets: 160 10*3/uL (ref 150–400)
RBC: 4.1 MIL/uL — ABNORMAL LOW (ref 4.22–5.81)
RDW: 13 % (ref 11.5–15.5)
WBC: 7.2 10*3/uL (ref 4.0–10.5)

## 2014-02-26 LAB — BASIC METABOLIC PANEL
Anion gap: 10 (ref 5–15)
BUN: 10 mg/dL (ref 6–23)
CO2: 29 mEq/L (ref 19–32)
Calcium: 9.6 mg/dL (ref 8.4–10.5)
Chloride: 102 mEq/L (ref 96–112)
Creatinine, Ser: 1 mg/dL (ref 0.50–1.35)
GFR calc Af Amer: 87 mL/min — ABNORMAL LOW (ref >90)
GFR calc non Af Amer: 75 mL/min — ABNORMAL LOW (ref >90)
Glucose, Bld: 109 mg/dL — ABNORMAL HIGH (ref 70–99)
Potassium: 4 mEq/L (ref 3.7–5.3)
Sodium: 141 mEq/L (ref 137–147)

## 2014-02-26 LAB — URINALYSIS, ROUTINE W REFLEX MICROSCOPIC
Bilirubin Urine: NEGATIVE
Glucose, UA: NEGATIVE mg/dL
Hgb urine dipstick: NEGATIVE
Ketones, ur: NEGATIVE mg/dL
Leukocytes, UA: NEGATIVE
Nitrite: NEGATIVE
Protein, ur: 30 mg/dL — AB
Specific Gravity, Urine: 1.011 (ref 1.005–1.030)
Urobilinogen, UA: 0.2 mg/dL (ref 0.0–1.0)
pH: 6 (ref 5.0–8.0)

## 2014-02-26 LAB — URINE MICROSCOPIC-ADD ON

## 2014-02-26 MED ORDER — HYDROCODONE-ACETAMINOPHEN 5-325 MG PO TABS
1.0000 | ORAL_TABLET | Freq: Once | ORAL | Status: AC
  Administered 2014-02-26: 1 via ORAL
  Filled 2014-02-26: qty 1

## 2014-02-26 MED ORDER — HYDROCODONE-ACETAMINOPHEN 5-325 MG PO TABS: 1.0000 | ORAL_TABLET | Freq: Four times a day (QID) | ORAL | Status: DC | PRN

## 2014-02-26 NOTE — ED Notes (Addendum)
Pt and family questioning if pt was to have xray of left forearm. Spoke with Dr Deretha EmoryZackowski. [facility] for xray of forearm to evaluate nodule prior to d/c

## 2014-02-26 NOTE — ED Notes (Signed)
Patient transported to CT 

## 2014-02-26 NOTE — ED Notes (Signed)
MD at bedside. 

## 2014-02-26 NOTE — Discharge Instructions (Signed)
Workup negative for evidence of a kidney stone or any urinary tract infection. Feel that this is probably musculoskeletal back pain. Take pain medicine as directed. Plan to follow your Dr. in the next few days. Return for any newer worse symptoms.

## 2014-02-26 NOTE — ED Notes (Signed)
Lower back pain sudden onset for 2 weeks.

## 2014-02-26 NOTE — ED Provider Notes (Addendum)
CSN: [id number]     Arrival date & time 02/26/14  1653 History  This chart was scribed for Vanetta MuldersScott Filimon Miranda, MD by Bronson CurbJacqueline Melvin, ED Scribe. This patient was seen in room MH12/MH12 and the patient's care was started at 6:51 PM.     Chief Complaint  Patient presents with  . Back Pain     Patient is a 68 y.o. male presenting with back pain. The history is provided by the patient. No language interpreter was used.  Back Pain Location:  Lumbar spine Quality: Sharp. Radiates to: LLQ and left groin area. Pain severity:  Severe Pain is:  Same all the time Onset quality:  Sudden Duration:  2 weeks Timing:  Constant Progression:  Unchanged Chronicity:  New Associated symptoms: abdominal pain, dysuria and headaches   Associated symptoms: no chest pain, no fever and no numbness     HPI Comments: Aaron Blevins is a 68 y.o. male, with history of DM, HTN, GERD, and stroke, who presents to the Emergency Department complaining of sudden onset, sharp, constant, lower back pain for the past 2 weeks. Patient rates the pain as 10/10 and states it radiates to his LLQ and left groin area. Patient reports associated dysuria and HA. He denies history of similar episodes. Patient denies numbness to bilateral feet and states the pain does not radiate down his legs. Patient also denies nausea, vomiting, hematuria, CP, leg swelling, HA, cough, sore throat, rhinorrhea, or blurry vision. Patient denies any injury or falls.  Patient is also complaining of a nodule located on his left forearm. Patient states the nodule is tender to the touch.   Past Medical History  Diagnosis Date  . Diabetes mellitus   . Hypertension   . GERD (gastroesophageal reflux disease)   . Trouble swallowing   . Arthritis     RIGHT KNEE  . Stroke     JUNE 2013--HAD TERRIBLE H/A--NO DEFICITS   Past Surgical History  Procedure Laterality Date  . Circumcision    . Esophagogastroduodenoscopy (egd) with propofol  04/12/2012     Procedure: ESOPHAGOGASTRODUODENOSCOPY (EGD) WITH PROPOFOL;  Surgeon: Willis ModenaWilliam Outlaw, MD;  Location: [facility];  Service: Endoscopy;  Laterality: N/A;  . Balloon dilation  04/12/2012    Procedure: BALLOON DILATION;  Surgeon: Willis ModenaWilliam Outlaw, MD;  Location: [facility];  Service: Endoscopy;  Laterality: N/A;   No family history on file. History  Substance Use Topics  . Smoking status: Never Smoker   . Smokeless tobacco: Never Used  . Alcohol Use: No    Review of Systems  Constitutional: Negative for fever and chills.  HENT: Negative for rhinorrhea and sore throat.   Eyes: Negative for visual disturbance.  Respiratory: Negative for cough and shortness of breath.   Cardiovascular: Negative for chest pain and leg swelling.  Gastrointestinal: Positive for abdominal pain. Negative for nausea and vomiting.  Genitourinary: Positive for dysuria and flank pain (left). Negative for hematuria.  Musculoskeletal: Positive for back pain.  Skin: Negative for rash.  Neurological: Positive for headaches. Negative for numbness.  Hematological: Does not bruise/bleed easily.  Psychiatric/Behavioral: Negative for confusion.      Allergies  Review of patient's allergies indicates no known allergies.  Home Medications   Prior to Admission medications   Medication Sig Start Date End Date Taking? Authorizing Provider  amoxicillin (AMOXIL) 500 MG capsule Take 1 capsule (500 mg total) by mouth 3 (three) times daily. 02/15/13   Teressa LowerVrinda Pickering, NP  aspirin 81 MG tablet Take 81 mg by mouth  daily.    Historical Provider, MD  diphenhydrAMINE (BENADRYL) 50 MG capsule Take 1 capsule (50 mg total) by mouth once. 01/28/14   Ethelda ChickMartha K Linker, MD  glipiZIDE (GLUCOTROL) 5 MG tablet Take 2.5 mg by mouth every morning. 1/2 TABLET DAILY    Historical Provider, MD  HYDROcodone-acetaminophen (NORCO) 5-325 MG per tablet Take 1-2 tablets by mouth every 6 (six) hours as needed (for pain). 01/12/14   Carlisle BeersJohn L Molpus, MD   HYDROcodone-acetaminophen (NORCO/VICODIN) 5-325 MG per tablet Take 1-2 tablets by mouth every 6 (six) hours as needed for moderate pain. 02/26/14   Vanetta MuldersScott Amelianna Meller, MD  ibuprofen (ADVIL,MOTRIN) 600 MG tablet Take 1 tablet (600 mg total) by mouth every 6 (six) hours as needed. 05/14/13   April K Palumbo-Rasch, MD  insulin glargine (LANTUS) 100 UNIT/ML injection Inject 10 Units into the skin at bedtime.    Historical Provider, MD  lisinopril (PRINIVIL,ZESTRIL) 5 MG tablet Take 5 mg by mouth every morning.     Historical Provider, MD  LOSARTAN POTASSIUM-HCTZ PO Take by mouth.    Historical Provider, MD  meloxicam (MOBIC) 7.5 MG tablet Take 1 tablet (7.5 mg total) by mouth daily. 06/02/13   April K Palumbo-Rasch, MD  metFORMIN (GLUCOPHAGE) 500 MG tablet Take 1,000 mg by mouth 2 (two) times daily with a meal.    Historical Provider, MD  omeprazole (PRILOSEC) 40 MG capsule Take 40 mg by mouth 2 (two) times daily. BEFORE A MEAL    Historical Provider, MD  ondansetron (ZOFRAN-ODT) 4 MG disintegrating tablet Take 1 tablet (4 mg total) by mouth once. 01/28/14   Ethelda ChickMartha K Linker, MD  oxyCODONE-acetaminophen (PERCOCET/ROXICET) 5-325 MG per tablet Take 1 tablet by mouth once. 01/28/14   Ethelda ChickMartha K Linker, MD  pravastatin (PRAVACHOL) 40 MG tablet Take 40 mg by mouth every morning.  10/20/11 10/19/12  Vassie Lollarlos Madera, MD  sucralfate (CARAFATE) 1 GM/10ML suspension Take 1 g by mouth 2 (two) times daily. 04/04/12   April K Palumbo-Rasch, MD   Triage Vitals: BP 141/77  Pulse 74  Temp(Src) 98.1 F (36.7 C) (Oral)  Resp 20  Ht 5\' 6"  (1.676 m)  Wt 180 lb (81.647 kg)  BMI 29.07 kg/m2  SpO2 99%  Physical Exam  Nursing note and vitals reviewed. Constitutional: He is oriented to person, place, and time. He appears well-developed and well-nourished. No distress.  HENT:  Head: Normocephalic and atraumatic.  Eyes: Conjunctivae and EOM are normal.  Neck: Neck supple.  Cardiovascular: Normal rate, regular rhythm and normal  heart sounds.   Pulses:      Dorsalis pedis pulses are 2+ on the right side, and 2+ on the left side.  Pulmonary/Chest: Effort normal and breath sounds normal. No respiratory distress.  Abdominal: Bowel sounds are normal. There is tenderness. There is CVA tenderness (left). There is no guarding.  Tenderness on left side of abdomen. Left CVA tenderness.  Musculoskeletal: Normal range of motion. He exhibits tenderness. He exhibits no edema.  Lumbar spine tenderness. No swelling in the ankle.   Neurological: He is alert and oriented to person, place, and time. No cranial nerve deficit. Coordination normal.  Skin: Skin is warm and dry.  On the left lateral forearm there is a 1cm nodule underneath the skin. Tender. Possibly a calcified cyst. Does not seem to be attached to muscle or tendons.  Psychiatric: He has a normal mood and affect. His behavior is normal.    ED Course  Procedures (including critical care time)  DIAGNOSTIC STUDIES:  Oxygen Saturation is 99% on room air, normal by my interpretation.    COORDINATION OF CARE: At 1857 Discussed treatment plan with patient which includes CT scan of abdomen. Patient agrees.   Medications  HYDROcodone-acetaminophen (NORCO/VICODIN) 5-325 MG per tablet 1 tablet (1 tablet Oral Given 02/26/14 2002)   Results for orders placed during the hospital encounter of 02/26/14  URINALYSIS, ROUTINE W REFLEX MICROSCOPIC      Result Value Ref Range   Color, Urine YELLOW  YELLOW   APPearance CLEAR  CLEAR   Specific Gravity, Urine 1.011  1.005 - 1.030   pH 6.0  5.0 - 8.0   Glucose, UA NEGATIVE  NEGATIVE mg/dL   Hgb urine dipstick NEGATIVE  NEGATIVE   Bilirubin Urine NEGATIVE  NEGATIVE   Ketones, ur NEGATIVE  NEGATIVE mg/dL   Protein, ur 30 (*) NEGATIVE mg/dL   Urobilinogen, UA 0.2  0.0 - 1.0 mg/dL   Nitrite NEGATIVE  NEGATIVE   Leukocytes, UA NEGATIVE  NEGATIVE  URINE MICROSCOPIC-ADD ON      Result Value Ref Range   Squamous Epithelial / LPF RARE   RARE   WBC, UA 0-2  <3 WBC/hpf   RBC / HPF 0-2  <3 RBC/hpf   Bacteria, UA RARE  RARE   Urine-Other MUCOUS PRESENT    CBC WITH DIFFERENTIAL      Result Value Ref Range   WBC 7.2  4.0 - 10.5 K/uL   RBC 4.10 (*) 4.22 - 5.81 MIL/uL   Hemoglobin 12.2 (*) 13.0 - 17.0 g/dL   HCT 16.1 (*) 09.6 - 04.5 %   MCV 86.8  78.0 - 100.0 fL   MCH 29.8  26.0 - 34.0 pg   MCHC 34.3  30.0 - 36.0 g/dL   RDW 40.9  81.1 - 91.4 %   Platelets 160  150 - 400 K/uL   Neutrophils Relative % 54  43 - 77 %   Neutro Abs 3.9  1.7 - 7.7 K/uL   Lymphocytes Relative 30  12 - 46 %   Lymphs Abs 2.2  0.7 - 4.0 K/uL   Monocytes Relative 11  3 - 12 %   Monocytes Absolute 0.8  0.1 - 1.0 K/uL   Eosinophils Relative 4  0 - 5 %   Eosinophils Absolute 0.3  0.0 - 0.7 K/uL   Basophils Relative 1  0 - 1 %   Basophils Absolute 0.1  0.0 - 0.1 K/uL  BASIC METABOLIC PANEL      Result Value Ref Range   Sodium 141  137 - 147 mEq/L   Potassium 4.0  3.7 - 5.3 mEq/L   Chloride 102  96 - 112 mEq/L   CO2 29  19 - 32 mEq/L   Glucose, Bld 109 (*) 70 - 99 mg/dL   BUN 10  6 - 23 mg/dL   Creatinine, Ser 7.82  0.50 - 1.35 mg/dL   Calcium 9.6  8.4 - 95.6 mg/dL   GFR calc non Af Amer 75 (*) >90 mL/min   GFR calc Af Amer 87 (*) >90 mL/min   Anion gap 10  5 - 15   Imaging Review Ct Renal Stone Study  02/26/2014   CLINICAL DATA:  LEFT flank pain.  Ureteral stone.  EXAM: CT ABDOMEN AND PELVIS WITHOUT CONTRAST  TECHNIQUE: Multidetector CT imaging of the abdomen and pelvis was performed following the standard protocol without IV contrast.  COMPARISON:  None.  FINDINGS: Musculoskeletal: No destructive osseous lesions. L5-S1 spondylolisthesis at lower lumbar  degenerative disease with degenerative grade I anterolisthesis of L4 on L5.  Lung Bases: Normal.  Liver: Unenhanced CT was performed per clinician order. Lack of IV contrast limits sensitivity and specificity, especially for evaluation of abdominal/pelvic solid viscera. Grossly normal.  Spleen:   Normal.  Gallbladder:  Normal.  Common bile duct:  Normal.  Pancreas:  Normal.  Adrenal glands: Mild thickening of the LEFT adrenal gland without a discrete nodule.  Kidneys: No renal calculi. No hydronephrosis. Both ureters appear within normal limits.  Stomach:  Patulous gastroesophageal junction.  Tiny hiatal hernia.  Small bowel:  Grossly normal.  Colon:   Normal appendix.  Large stool burden.  Pelvic Genitourinary:  Normal urinary bladder.  No free fluid.  Vasculature: Atherosclerosis.  No acute abnormality.  Body Wall: Tiny fat containing periumbilical hernia.  IMPRESSION: 1. No acute abnormality. 2. Negative for urolithiasis.   Electronically Signed   By: Andreas Newport M.D.   On: 02/26/2014 19:44    EKG Interpretation None      MDM   Final diagnoses:  Midline low back pain without sciatica    Workup negative for ureteral stone or urinary tract infection.  Clinically felt the patient pain pattern could be consistent with this. In addition no evidence of any bony abnormalities as noted on the CT scan. We'll treat his musculoskeletal back. As noted above there was the L5-S1 spondylolisthesis. This could be contributing. Will treat with pain medication and close followup with his doctor.  I personally performed the services described in this documentation, which was scribed in my presence. The recorded information has been reviewed and is accurate.     Vanetta Mulders, MD 02/26/14 2024  Addendum: Forgot to order the x-ray of the patient's left forearm for the subcutaneous hard nodule that was felt that forearm. X-rays are being taken now.  Vanetta Mulders, MD 02/26/14 2101

## 2014-07-29 ENCOUNTER — Emergency Department (HOSPITAL_BASED_OUTPATIENT_CLINIC_OR_DEPARTMENT_OTHER): Payer: Medicare Other

## 2014-07-29 ENCOUNTER — Emergency Department (HOSPITAL_BASED_OUTPATIENT_CLINIC_OR_DEPARTMENT_OTHER)
Admission: EM | Admit: 2014-07-29 | Discharge: 2014-07-29 | Disposition: A | Discharge status: Home / Self Care | Payer: Medicare Other | Attending: Emergency Medicine | Admitting: Emergency Medicine

## 2014-07-29 ENCOUNTER — Encounter (HOSPITAL_BASED_OUTPATIENT_CLINIC_OR_DEPARTMENT_OTHER): Payer: Self-pay | Admitting: Emergency Medicine

## 2014-07-29 DIAGNOSIS — Z794 Long term (current) use of insulin: Secondary | ICD-10-CM | POA: Diagnosis not present

## 2014-07-29 DIAGNOSIS — E119 Type 2 diabetes mellitus without complications: Secondary | ICD-10-CM | POA: Diagnosis not present

## 2014-07-29 DIAGNOSIS — Z792 Long term (current) use of antibiotics: Secondary | ICD-10-CM | POA: Diagnosis not present

## 2014-07-29 DIAGNOSIS — Z791 Long term (current) use of non-steroidal anti-inflammatories (NSAID): Secondary | ICD-10-CM | POA: Insufficient documentation

## 2014-07-29 DIAGNOSIS — K219 Gastro-esophageal reflux disease without esophagitis: Secondary | ICD-10-CM | POA: Insufficient documentation

## 2014-07-29 DIAGNOSIS — Z8673 Personal history of transient ischemic attack (TIA), and cerebral infarction without residual deficits: Secondary | ICD-10-CM | POA: Diagnosis not present

## 2014-07-29 DIAGNOSIS — R51 Headache: Secondary | ICD-10-CM | POA: Diagnosis not present

## 2014-07-29 DIAGNOSIS — R5383 Other fatigue: Secondary | ICD-10-CM | POA: Diagnosis not present

## 2014-07-29 DIAGNOSIS — Z7982 Long term (current) use of aspirin: Secondary | ICD-10-CM | POA: Diagnosis not present

## 2014-07-29 DIAGNOSIS — I10 Essential (primary) hypertension: Secondary | ICD-10-CM | POA: Insufficient documentation

## 2014-07-29 DIAGNOSIS — R519 Headache, unspecified: Secondary | ICD-10-CM

## 2014-07-29 DIAGNOSIS — M1990 Unspecified osteoarthritis, unspecified site: Secondary | ICD-10-CM | POA: Insufficient documentation

## 2014-07-29 LAB — BASIC METABOLIC PANEL
Anion gap: 9 (ref 5–15)
BUN: 12 mg/dL (ref 6–23)
CO2: 29 mmol/L (ref 19–32)
Calcium: 9.4 mg/dL (ref 8.4–10.5)
Chloride: 104 mmol/L (ref 96–112)
Creatinine, Ser: 1.2 mg/dL (ref 0.50–1.35)
GFR calc Af Amer: 69 mL/min — ABNORMAL LOW (ref >90)
GFR calc non Af Amer: 60 mL/min — ABNORMAL LOW (ref >90)
Glucose, Bld: 121 mg/dL — ABNORMAL HIGH (ref 70–99)
Potassium: 3.8 mmol/L (ref 3.5–5.1)
Sodium: 142 mmol/L (ref 135–145)

## 2014-07-29 LAB — CBC WITH DIFFERENTIAL/PLATELET
Basophils Absolute: 0 10*3/uL (ref 0.0–0.1)
Basophils Relative: 1 % (ref 0–1)
Eosinophils Absolute: 0.2 10*3/uL (ref 0.0–0.7)
Eosinophils Relative: 3 % (ref 0–5)
HCT: 36.9 % — ABNORMAL LOW (ref 39.0–52.0)
Hemoglobin: 12.7 g/dL — ABNORMAL LOW (ref 13.0–17.0)
Lymphocytes Relative: 26 % (ref 12–46)
Lymphs Abs: 1.6 10*3/uL (ref 0.7–4.0)
MCH: 29.9 pg (ref 26.0–34.0)
MCHC: 34.4 g/dL (ref 30.0–36.0)
MCV: 86.8 fL (ref 78.0–100.0)
Monocytes Absolute: 0.7 10*3/uL (ref 0.1–1.0)
Monocytes Relative: 11 % (ref 3–12)
Neutro Abs: 3.7 10*3/uL (ref 1.7–7.7)
Neutrophils Relative %: 59 % (ref 43–77)
Platelets: 149 10*3/uL — ABNORMAL LOW (ref 150–400)
RBC: 4.25 MIL/uL (ref 4.22–5.81)
RDW: 12.4 % (ref 11.5–15.5)
WBC: 6.3 10*3/uL (ref 4.0–10.5)

## 2014-07-29 LAB — CBG MONITORING, ED: Glucose-Capillary: 163 mg/dL — ABNORMAL HIGH (ref 70–99)

## 2014-07-29 MED ORDER — HYDRALAZINE HCL 20 MG/ML IJ SOLN
10.0000 mg | Freq: Once | INTRAMUSCULAR | Status: AC
  Administered 2014-07-29: 10 mg via INTRAVENOUS
  Filled 2014-07-29: qty 1

## 2014-07-29 MED ORDER — CLONIDINE HCL 0.1 MG PO TABS: 0.2000 mg | ORAL_TABLET | Freq: Two times a day (BID) | ORAL | Status: DC

## 2014-07-29 MED ORDER — CLONIDINE HCL 0.1 MG PO TABS
0.1000 mg | ORAL_TABLET | Freq: Once | ORAL | Status: AC
  Administered 2014-07-29: 0.1 mg via ORAL
  Filled 2014-07-29: qty 1

## 2014-07-29 NOTE — ED Notes (Signed)
Pt states that for about a week his blood pressure has been going up and down.

## 2014-07-29 NOTE — ED Notes (Signed)
Patient transported to CT 

## 2014-07-29 NOTE — Discharge Instructions (Signed)
Continue Losartan at current dose. Begin new Rx for Clonidine tomorrow. Check and record your Blood Pressure morning and afternoon for your next physician appointment. [facility] follow up appointment.  Hypertension Hypertension is another name for high blood pressure. High blood pressure forces your heart to work harder to pump blood. A blood pressure reading has two numbers, which includes a higher number over a lower number (example: 110/72). HOME CARE   Have your blood pressure rechecked by your doctor.  Only take medicine as told by your doctor. Follow the directions carefully. The medicine does not work as well if you skip doses. Skipping doses also puts you at risk for problems.  Do not smoke.  Monitor your blood pressure at home as told by your doctor. GET HELP IF:  You think you are having a reaction to the medicine you are taking.  You have repeat headaches or feel dizzy.  You have puffiness (swelling) in your ankles.  You have trouble with your vision. GET HELP RIGHT AWAY IF:   You get a very bad headache and are confused.  You feel weak, numb, or faint.  You get chest or belly (abdominal) pain.  You throw up (vomit).  You cannot breathe very well. MAKE SURE YOU:   Understand these instructions.  Will watch your condition.  Will get help right away if you are not doing well or get worse. Document Released: 10/20/2007 Document Revised: 05/08/2013 Document Reviewed: 02/23/2013 [facility] Patient Information 2015 [employer] [employer] [place]. This information is not intended to replace advice given to you by your health care provider. Make sure you discuss any questions you have with your health care provider.

## 2014-07-29 NOTE — ED Provider Notes (Signed)
CSN: [id number]     Arrival date & time 07/29/14  1301 History   First MD Initiated Contact with Patient 07/29/14 1332     Chief Complaint  Patient presents with  . Hypertension      HPI  Patient presents for evaluation. His main complaint is that he feels tired. He states that his wife's sister has been sick, and recently passed away. He had his wife when traveling a fair amount for the last month. He treats his fatigue to this. However he states for the last 7-10 days his blood pressures have been consistently high. He was ago he was changed from lisinopril, to losartan. Has been on the same dose of losartan 100 mg for the last 2 years. He states his blood pressure will be as low as 150/90, or as high as 210/125. This is not seemed to be related to time of day.    Not had a lateral weakness. No chest pain. No shortness of breath. States he has a mild headache. No swelling. Normal urine output.  Past Medical History  Diagnosis Date  . Diabetes mellitus   . Hypertension   . GERD (gastroesophageal reflux disease)   . Trouble swallowing   . Arthritis     RIGHT KNEE  . Stroke     JUNE 2013--HAD TERRIBLE H/A--NO DEFICITS   Past Surgical History  Procedure Laterality Date  . Circumcision    . Esophagogastroduodenoscopy (egd) with propofol  04/12/2012    Procedure: ESOPHAGOGASTRODUODENOSCOPY (EGD) WITH PROPOFOL;  Surgeon: Willis Modena, MD;  Location: [facility];  Service: Endoscopy;  Laterality: N/A;  . Balloon dilation  04/12/2012    Procedure: BALLOON DILATION;  Surgeon: Willis Modena, MD;  Location: [facility];  Service: Endoscopy;  Laterality: N/A;   History reviewed. No pertinent family history. History  Substance Use Topics  . Smoking status: Never Smoker   . Smokeless tobacco: Never Used  . Alcohol Use: No    Review of Systems  Constitutional: Positive for fatigue. Negative for fever, chills, diaphoresis and appetite change.  HENT: Negative for mouth sores, sore  throat and trouble swallowing.   Eyes: Negative for visual disturbance.  Respiratory: Negative for cough, chest tightness, shortness of breath and wheezing.   Cardiovascular: Negative for chest pain.  Gastrointestinal: Negative for nausea, vomiting, abdominal pain, diarrhea and abdominal distention.  Endocrine: Negative for polydipsia, polyphagia and polyuria.  Genitourinary: Negative for dysuria, frequency and hematuria.  Musculoskeletal: Negative for gait problem.  Skin: Negative for color change, pallor and rash.  Neurological: Negative for dizziness, syncope, light-headedness and headaches.  Hematological: Does not bruise/bleed easily.  Psychiatric/Behavioral: Negative for behavioral problems and confusion.      Allergies  Review of patient's allergies indicates no known allergies.  Home Medications   Prior to Admission medications   Medication Sig Start Date End Date Taking? Authorizing Provider  amoxicillin (AMOXIL) 500 MG capsule Take 1 capsule (500 mg total) by mouth 3 (three) times daily. 02/15/13   Teressa Lower, NP  aspirin 81 MG tablet Take 81 mg by mouth daily.    Historical Provider, MD  diphenhydrAMINE (BENADRYL) 50 MG capsule Take 1 capsule (50 mg total) by mouth once. 01/28/14   Jerelyn Scott, MD  glipiZIDE (GLUCOTROL) 5 MG tablet Take 2.5 mg by mouth every morning. 1/2 TABLET DAILY    Historical Provider, MD  HYDROcodone-acetaminophen (NORCO) 5-325 MG per tablet Take 1-2 tablets by mouth every 6 (six) hours as needed (for pain). 01/12/14   John Molpus,  MD  HYDROcodone-acetaminophen (NORCO/VICODIN) 5-325 MG per tablet Take 1-2 tablets by mouth every 6 (six) hours as needed for moderate pain. 02/26/14   Vanetta Mulders, MD  ibuprofen (ADVIL,MOTRIN) 600 MG tablet Take 1 tablet (600 mg total) by mouth every 6 (six) hours as needed. 05/14/13   April Palumbo, MD  insulin glargine (LANTUS) 100 UNIT/ML injection Inject 10 Units into the skin at bedtime.    Historical Provider,  MD  lisinopril (PRINIVIL,ZESTRIL) 5 MG tablet Take 5 mg by mouth every morning.     Historical Provider, MD  LOSARTAN POTASSIUM-HCTZ PO Take by mouth.    Historical Provider, MD  meloxicam (MOBIC) 7.5 MG tablet Take 1 tablet (7.5 mg total) by mouth daily. 06/02/13   April Palumbo, MD  metFORMIN (GLUCOPHAGE) 500 MG tablet Take 1,000 mg by mouth 2 (two) times daily with a meal.    Historical Provider, MD  omeprazole (PRILOSEC) 40 MG capsule Take 40 mg by mouth 2 (two) times daily. BEFORE A MEAL    Historical Provider, MD  ondansetron (ZOFRAN-ODT) 4 MG disintegrating tablet Take 1 tablet (4 mg total) by mouth once. 01/28/14   Jerelyn Scott, MD  oxyCODONE-acetaminophen (PERCOCET/ROXICET) 5-325 MG per tablet Take 1 tablet by mouth once. 01/28/14   Jerelyn Scott, MD  pravastatin (PRAVACHOL) 40 MG tablet Take 40 mg by mouth every morning.  10/20/11 10/19/12  Vassie Loll, MD  sucralfate (CARAFATE) 1 GM/10ML suspension Take 1 g by mouth 2 (two) times daily. 04/04/12   April Palumbo, MD   BP 169/91 mmHg  Pulse 61  Temp(Src) 98.3 F (36.8 C) (Oral)  Resp 13  Ht  (1.676 m)  Wt 183 lb (83.008 kg)  BMI 29.55 kg/m2  SpO2 99% Physical Exam  Constitutional: He is oriented to person, place, and time. He appears well-developed and well-nourished. No distress.  HENT:  Head: Normocephalic.  Eyes: Conjunctivae are normal. Pupils are equal, round, and reactive to light. No scleral icterus.  Neck: Normal range of motion. Neck supple. No thyromegaly present.  Cardiovascular: Normal rate and regular rhythm.  Exam reveals no gallop and no friction rub.   No murmur heard. Pulmonary/Chest: Effort normal and breath sounds normal. No respiratory distress. He has no wheezes. He has no rales.  Abdominal: Soft. Bowel sounds are normal. He exhibits no distension. There is no tenderness. There is no rebound.  Musculoskeletal: Normal range of motion.  Neurological: He is alert and oriented to person, place, and time.  No  unilateral weakness. Awake and alert.  Skin: Skin is warm and dry. No rash noted.  No dependent edema.  Psychiatric: He has a normal mood and affect. His behavior is normal.    ED Course  Procedures (including critical care time) Labs Review Labs Reviewed  CBC WITH DIFFERENTIAL/PLATELET - Abnormal; Notable for the following:    Hemoglobin 12.7 (*)    HCT 36.9 (*)    Platelets 149 (*)    All other components within normal limits  BASIC METABOLIC PANEL - Abnormal; Notable for the following:    Glucose, Bld 121 (*)    GFR calc non Af Amer 60 (*)    GFR calc Af Amer 69 (*)    All other components within normal limits  CBG MONITORING, ED - Abnormal; Notable for the following:    Glucose-Capillary 163 (*)    All other components within normal limits    Imaging Review No results found.   EKG Interpretation   Date/Time:  Monday July 29 2014 14:12:22 EDT Ventricular Rate:  69 PR Interval:  154 QRS Duration: 86 QT Interval:  382 QTC Calculation: 409 R Axis:   13 Text Interpretation:  Sinus rhythm with marked sinus arrhythmia Otherwise  normal ECG Confirmed by Fayrene FearingJAMES  MD, Genae Strine ([id number]) on 07/29/2014 3:18:08 PM      MDM   Final diagnoses:  Headache    Patient hypertensive with minimal other symptoms. Does describe mild headache. No confusion. Normal neural exam. Plan CT head, EKG, basic lab evaluation. Several blood pressure readings obtained here after patient being supine and remain high, greater than 200 systolic, gr 100 diastolic. Given hydralazine. Will follow blood pressures and reassess.    Rolland PorterMark Bonny Egger, MD 08/02/14 1538

## 2014-08-10 ENCOUNTER — Encounter (HOSPITAL_BASED_OUTPATIENT_CLINIC_OR_DEPARTMENT_OTHER): Payer: Self-pay

## 2014-08-10 ENCOUNTER — Emergency Department (HOSPITAL_BASED_OUTPATIENT_CLINIC_OR_DEPARTMENT_OTHER): Payer: Medicare Other

## 2014-08-10 ENCOUNTER — Emergency Department (HOSPITAL_BASED_OUTPATIENT_CLINIC_OR_DEPARTMENT_OTHER)
Admission: EM | Admit: 2014-08-10 | Discharge: 2014-08-10 | Disposition: A | Discharge status: Home / Self Care | Payer: Medicare Other | Attending: Emergency Medicine | Admitting: Emergency Medicine

## 2014-08-10 DIAGNOSIS — Z794 Long term (current) use of insulin: Secondary | ICD-10-CM | POA: Insufficient documentation

## 2014-08-10 DIAGNOSIS — Z7982 Long term (current) use of aspirin: Secondary | ICD-10-CM | POA: Diagnosis not present

## 2014-08-10 DIAGNOSIS — E119 Type 2 diabetes mellitus without complications: Secondary | ICD-10-CM | POA: Insufficient documentation

## 2014-08-10 DIAGNOSIS — Z792 Long term (current) use of antibiotics: Secondary | ICD-10-CM | POA: Diagnosis not present

## 2014-08-10 DIAGNOSIS — R609 Edema, unspecified: Secondary | ICD-10-CM

## 2014-08-10 DIAGNOSIS — I10 Essential (primary) hypertension: Secondary | ICD-10-CM | POA: Insufficient documentation

## 2014-08-10 DIAGNOSIS — Z8673 Personal history of transient ischemic attack (TIA), and cerebral infarction without residual deficits: Secondary | ICD-10-CM | POA: Insufficient documentation

## 2014-08-10 DIAGNOSIS — M1990 Unspecified osteoarthritis, unspecified site: Secondary | ICD-10-CM | POA: Diagnosis not present

## 2014-08-10 DIAGNOSIS — Z79899 Other long term (current) drug therapy: Secondary | ICD-10-CM | POA: Diagnosis not present

## 2014-08-10 DIAGNOSIS — Z791 Long term (current) use of non-steroidal anti-inflammatories (NSAID): Secondary | ICD-10-CM | POA: Diagnosis not present

## 2014-08-10 DIAGNOSIS — M25562 Pain in left knee: Secondary | ICD-10-CM

## 2014-08-10 DIAGNOSIS — M7122 Synovial cyst of popliteal space [Baker], left knee: Secondary | ICD-10-CM

## 2014-08-10 DIAGNOSIS — K219 Gastro-esophageal reflux disease without esophagitis: Secondary | ICD-10-CM | POA: Insufficient documentation

## 2014-08-10 LAB — CBG MONITORING, ED: Glucose-Capillary: 89 mg/dL (ref 70–99)

## 2014-08-10 MED ORDER — NAPROXEN 375 MG PO TABS: 375.0000 mg | ORAL_TABLET | Freq: Two times a day (BID) | ORAL | Status: DC

## 2014-08-10 MED ORDER — KETOROLAC TROMETHAMINE 30 MG/ML IJ SOLN
30.0000 mg | Freq: Once | INTRAMUSCULAR | Status: AC
  Administered 2014-08-10: 30 mg via INTRAMUSCULAR
  Filled 2014-08-10: qty 1

## 2014-08-10 MED ORDER — DEXAMETHASONE 1 MG/ML PO CONC
10.0000 mg | Freq: Once | ORAL | Status: AC
  Administered 2014-08-10: 10 mg via ORAL
  Filled 2014-08-10: qty 10

## 2014-08-10 NOTE — Discharge Instructions (Signed)
Arthralgia °Your caregiver has diagnosed you as suffering from an arthralgia. Arthralgia means there is pain in a joint. This can come from many reasons including: °· Bruising the joint which causes soreness (inflammation) in the joint. °· Wear and tear on the joints which occur as we grow older (osteoarthritis). °· Overusing the joint. °· Various forms of arthritis. °· Infections of the joint. °Regardless of the cause of pain in your joint, most of these different pains respond to anti-inflammatory drugs and rest. The exception to this is when a joint is infected, and these cases are treated with antibiotics, if it is a bacterial infection. °HOME CARE INSTRUCTIONS  °· Rest the injured area for as long as directed by your caregiver. Then slowly start using the joint as directed by your caregiver and as the pain allows. Crutches as directed may be useful if the ankles, knees or hips are involved. If the knee was splinted or casted, continue use and care as directed. If an stretchy or elastic wrapping bandage has been applied today, it should be removed and re-applied every 3 to 4 hours. It should not be applied tightly, but firmly enough to keep swelling down. Watch toes and feet for swelling, bluish discoloration, coldness, numbness or excessive pain. If any of these problems (symptoms) occur, remove the ace bandage and re-apply more loosely. If these symptoms persist, contact your caregiver or return to this location. °· For the first 24 hours, keep the injured extremity elevated on pillows while lying down. °· Apply ice for 15-20 minutes to the sore joint every couple hours while awake for the first half day. Then 03-04 times per day for the first 48 hours. Put the ice in a plastic bag and place a towel between the bag of ice and your skin. °· Wear any splinting, casting, elastic bandage applications, or slings as instructed. °· Only take over-the-counter or prescription medicines for pain, discomfort, or fever as  directed by your caregiver. Do not use aspirin immediately after the injury unless instructed by your physician. Aspirin can cause increased bleeding and bruising of the tissues. °· If you were given crutches, continue to use them as instructed and do not resume weight bearing on the sore joint until instructed. °Persistent pain and inability to use the sore joint as directed for more than 2 to 3 days are warning signs indicating that you should see a caregiver for a follow-up visit as soon as possible. Initially, a hairline fracture (break in bone) may not be evident on X-rays. Persistent pain and swelling indicate that further evaluation, non-weight bearing or use of the joint (use of crutches or slings as instructed), or further X-rays are indicated. X-rays may sometimes not show a small fracture until a week or 10 days later. Make a follow-up appointment with your own caregiver or one to whom we have referred you. A radiologist (specialist in reading X-rays) may read your X-rays. Make sure you know how you are to obtain your X-ray results. Do not assume everything is normal if you do not hear from us. °SEEK MEDICAL CARE IF: °Bruising, swelling, or pain increases. °SEEK IMMEDIATE MEDICAL CARE IF:  °· Your fingers or toes are numb or blue. °· The pain is not responding to medications and continues to stay the same or get worse. °· The pain in your joint becomes severe. °· You develop a fever over 102° F (38.9° C). °· It becomes impossible to move or use the joint. °MAKE SURE YOU:  °·   Understand these instructions.  Will watch your condition.  Will get help right away if you are not doing well or get worse. Document Released: 05/03/2005 Document Revised: 07/26/2011 Document Reviewed: 12/20/2007 [facility] Patient Information 2015 [place], [place]. This information is not intended to replace advice given to you by your health care provider. Make sure you discuss any questions you have with your health care  provider.  Baker Cyst A Baker cyst is a sac-like structure that forms in the back of the knee. It is filled with the same fluid that is located in your knee. This fluid lubricates the bones and cartilage of the knee and allows them to move over each other more easily. CAUSES  When the knee becomes injured or inflamed, increased fluid forms in the knee. When this happens, the joint lining is pushed out behind the knee and forms the Baker cyst. This cyst may also be caused by inflammation from arthritic conditions and infections. SIGNS AND SYMPTOMS  A Baker cyst usually has no symptoms. When the cyst is substantially enlarged:  You may feel pressure behind the knee, stiffness in the knee, or a mass in the area behind the knee.  You may develop pain, redness, and swelling in the calf. This can suggest a blood clot and requires evaluation by your health care provider. DIAGNOSIS  A Baker cyst is most often found during an ultrasound exam. This exam may have been performed for other reasons, and the cyst was found incidentally. Sometimes an MRI is used. This picks up other problems within a joint that an ultrasound exam may not. If the Baker cyst developed immediately after an injury, X-ray exams may be used to diagnose the cyst. TREATMENT  The treatment depends on the cause of the cyst. Anti-inflammatory medicines and rest often will be prescribed. If the cyst is caused by a bacterial infection, antibiotic medicines may be prescribed.  HOME CARE INSTRUCTIONS   If the cyst was caused by an injury, for the first 24 hours, keep the injured leg elevated on 2 pillows while lying down.  For the first 24 hours while you are awake, apply ice to the injured area:  Put ice in a plastic bag.  Place a towel between your skin and the bag.  Leave the ice on for 20 minutes, 2-3 times a day.  Only take over-the-counter or prescription medicines for pain, discomfort, or fever as directed by your health care  provider.  Only take antibiotic medicine as directed. Make sure to finish it even if you start to feel better. MAKE SURE YOU:   Understand these instructions.  Will watch your condition.  Will get help right away if you are not doing well or get worse. Document Released: 05/03/2005 Document Revised: 02/21/2013 Document Reviewed: 12/13/2012 [facility] Patient Information 2015 [place], [place]. This information is not intended to replace advice given to you by your health care provider. Make sure you discuss any questions you have with your health care provider.

## 2014-08-10 NOTE — ED Provider Notes (Signed)
CSN: [id number]     Arrival date & time 08/10/14  1555 History  This chart was scribed for Tilden Fossa, MD by [place], ED Scribe. The patient was seen in [facility]. The patient's care was started at 4:19 PM.    Chief Complaint  Patient presents with  . Joint Swelling     The history is provided by the patient. No language interpreter was used.  HPI Comments: Aaron Blevins is a 68 y.o. male who presents to the Emergency Department complaining of severe left knee pain and swelling x 1 week. Pain exacerbated with ambulation and movement of affected knee. Pt denies any mechanisms of injury that could be responsible for current pain. Wife states pt has been resting a lot this week which gave pt some relief. She adds that since pt was feeling better, he got up this morning to do some yardwork which caused knee to flare up again. No complaints of SOB, chest pain, abdominal pain, nausea, or vomiting. He reports history of knee problems in the past. History of DM, hypertension, and arthritis. Pt is non-smoker.    Past Medical History  Diagnosis Date  . Diabetes mellitus   . Hypertension   . GERD (gastroesophageal reflux disease)   . Trouble swallowing   . Arthritis     RIGHT KNEE  . Stroke     JUNE 2013--HAD TERRIBLE H/A--NO DEFICITS   Past Surgical History  Procedure Laterality Date  . Circumcision    . Esophagogastroduodenoscopy (egd) with propofol  04/12/2012    Procedure: ESOPHAGOGASTRODUODENOSCOPY (EGD) WITH PROPOFOL;  Surgeon: Willis Modena, MD;  Location: [facility];  Service: Endoscopy;  Laterality: N/A;  . Balloon dilation  04/12/2012    Procedure: BALLOON DILATION;  Surgeon: Willis Modena, MD;  Location: [facility];  Service: Endoscopy;  Laterality: N/A;   No family history on file. History  Substance Use Topics  . Smoking status: Never Smoker   . Smokeless tobacco: Never Used  . Alcohol Use: No    Review of Systems  Respiratory: Negative for shortness of  breath.   Cardiovascular: Negative for chest pain.  Gastrointestinal: Negative for nausea, vomiting and abdominal pain.  Musculoskeletal: Positive for joint swelling and arthralgias.       Right knee pain with swelling.   All other systems reviewed and are negative.     Allergies  Review of patient's allergies indicates no known allergies.  Home Medications   Prior to Admission medications   Medication Sig Start Date End Date Taking? Authorizing Provider  amoxicillin (AMOXIL) 500 MG capsule Take 1 capsule (500 mg total) by mouth 3 (three) times daily. 02/15/13   Teressa Lower, NP  aspirin 81 MG tablet Take 81 mg by mouth daily.    Historical Provider, MD  cloNIDine (CATAPRES) 0.1 MG tablet Take 2 tablets (0.2 mg total) by mouth 2 (two) times daily. 07/29/14   Rolland Porter, MD  diphenhydrAMINE (BENADRYL) 50 MG capsule Take 1 capsule (50 mg total) by mouth once. 01/28/14   Jerelyn Scott, MD  glipiZIDE (GLUCOTROL) 5 MG tablet Take 2.5 mg by mouth every morning. 1/2 TABLET DAILY    Historical Provider, MD  HYDROcodone-acetaminophen (NORCO) 5-325 MG per tablet Take 1-2 tablets by mouth every 6 (six) hours as needed (for pain). 01/12/14   Paula Libra, MD  HYDROcodone-acetaminophen (NORCO/VICODIN) 5-325 MG per tablet Take 1-2 tablets by mouth every 6 (six) hours as needed for moderate pain. 02/26/14   Vanetta Mulders, MD  ibuprofen (ADVIL,MOTRIN) 600 MG tablet  Take 1 tablet (600 mg total) by mouth every 6 (six) hours as needed. 05/14/13   April Palumbo, MD  insulin glargine (LANTUS) 100 UNIT/ML injection Inject 10 Units into the skin at bedtime.    Historical Provider, MD  lisinopril (PRINIVIL,ZESTRIL) 5 MG tablet Take 5 mg by mouth every morning.     Historical Provider, MD  LOSARTAN POTASSIUM-HCTZ PO Take by mouth.    Historical Provider, MD  meloxicam (MOBIC) 7.5 MG tablet Take 1 tablet (7.5 mg total) by mouth daily. 06/02/13   April Palumbo, MD  metFORMIN (GLUCOPHAGE) 500 MG tablet Take 1,000  mg by mouth 2 (two) times daily with a meal.    Historical Provider, MD  omeprazole (PRILOSEC) 40 MG capsule Take 40 mg by mouth 2 (two) times daily. BEFORE A MEAL    Historical Provider, MD  ondansetron (ZOFRAN-ODT) 4 MG disintegrating tablet Take 1 tablet (4 mg total) by mouth once. 01/28/14   Jerelyn Scott, MD  oxyCODONE-acetaminophen (PERCOCET/ROXICET) 5-325 MG per tablet Take 1 tablet by mouth once. 01/28/14   Jerelyn Scott, MD  pravastatin (PRAVACHOL) 40 MG tablet Take 40 mg by mouth every morning.  10/20/11 10/19/12  Vassie Loll, MD  sucralfate (CARAFATE) 1 GM/10ML suspension Take 1 g by mouth 2 (two) times daily. 04/04/12   April Palumbo, MD   Pulse 72  Temp(Src) 98.4 F (36.9 C) (Oral)  Resp 18  Ht  (1.676 m)  Wt 185 lb (83.915 kg)  BMI 29.87 kg/m2  SpO2 96% Physical Exam  Constitutional: He is oriented to person, place, and time. He appears well-developed and well-nourished.  HENT:  Head: Normocephalic and atraumatic.  Cardiovascular: Normal rate.   Pulmonary/Chest: Effort normal. No respiratory distress.  Musculoskeletal:  2+ DP pulses in BLE.  1+ pitting edema in the LLE from the knee to the ankle.  Moderate left knee effusion with mild erythema.  Full ROM intact at the knee with minimal tenderness to palpation in the knee.    Neurological: He is alert and oriented to person, place, and time.  Skin: Skin is warm and dry.  Psychiatric: He has a normal mood and affect. His behavior is normal.  Nursing note and vitals reviewed.   ED Course  Procedures (including critical care time) Labs Review Labs Reviewed - No data to display  Imaging Review US Venous Img Lower Unilateral Left  08/10/2014   CLINICAL DATA:  69 year old male with left knee pain and swelling for the past 6 days, with some radiation of pain to the left ankle intermittently.  EXAM: LEFT LOWER EXTREMITY VENOUS DOPPLER ULTRASOUND  TECHNIQUE: Gray-scale sonography with graded compression, as well as color  Doppler and duplex ultrasound were performed to evaluate the lower extremity deep venous systems from the level of the common femoral vein and including the common femoral, femoral, profunda femoral, popliteal and calf veins including the posterior tibial, peroneal and gastrocnemius veins when visible. The superficial great saphenous vein was also interrogated. Spectral Doppler was utilized to evaluate flow at rest and with distal augmentation maneuvers in the common femoral, femoral and popliteal veins.  COMPARISON:  No priors.  FINDINGS: Contralateral Common Femoral Vein: Respiratory phasicity is normal and symmetric with the symptomatic side. No evidence of thrombus. Normal compressibility.  Common Femoral Vein: No evidence of thrombus. Normal compressibility, respiratory phasicity and response to augmentation.  Saphenofemoral Junction: No evidence of thrombus. Normal compressibility and flow on color Doppler imaging.  Profunda Femoral Vein: No evidence of thrombus. Normal compressibility and  flow on color Doppler imaging.  Femoral Vein: No evidence of thrombus. Normal compressibility, respiratory phasicity and response to augmentation.  Popliteal Vein: No evidence of thrombus. Normal compressibility, respiratory phasicity and response to augmentation.  Calf Veins: No evidence of thrombus. Normal compressibility and flow on color Doppler imaging.  Superficial Great Saphenous Vein: No evidence of thrombus. Normal compressibility and flow on color Doppler imaging.  Venous Reflux:  None.  Other Findings: Anechoic to hypoechoic collection of fluid posterior to the left knee in the midline measuring approximately 2.5 x 0.6 x 2.0 cm, likely a small Baker cyst.  IMPRESSION: 1. No evidence of left lower extremity deep venous thrombosis. 2. Probable small left-sided Baker's cyst.   Electronically Signed   By: Trudie Reedaniel  Entrikin M.D.   On: 08/10/2014 17:48   Dg Knee Complete 4 Views Left  08/10/2014   CLINICAL DATA:  Left  knee pain and swelling for 1 week. Unable to bear weight.  EXAM: LEFT KNEE - COMPLETE 4+ VIEW  COMPARISON:  None.  FINDINGS: Tricompartmental osteoarthritis. Medial compartment joint space narrowing. Chondrocalcinosis. Normal anatomic alignment. No evidence for acute fracture. Small joint effusion.  IMPRESSION: Tricompartmental osteoarthritis.  No acute osseous abnormality.   Electronically Signed   By: Annia Beltrew  Davis M.D.   On: 08/10/2014 17:08     EKG Interpretation None     Medications - No data to display  4:27 PM- Treatment plan was discussed with patient who verbalizes understanding and agrees.   MDM   Final diagnoses:  Left knee pain  Baker's cyst, left    Patient here for evaluation of left knee pain. Patient is well perfused on exam without evidence of acute infectious process or gouty arthritis. Vascular ultrasound obtained given swelling of the left lower extremity which was negative for DVT. Discussed with patient home care for arthritis. A one-time dose of steroids for inflammation starting a short course of naproxen. Discussed with patient rest, PCP follow-up, return precautions.  I personally performed the services described in this documentation, which was scribed in my presence. The recorded information has been reviewed and is accurate.    Tilden FossaElizabeth Dashan Chizmar, MD 08/10/14 [phone]

## 2014-08-10 NOTE — ED Notes (Signed)
Pt reports intermittent swelling to L knee swelling and pain, thinks its r/t arthritis.  No trauma to knee.

## 2015-06-06 DIAGNOSIS — E119 Type 2 diabetes mellitus without complications: Secondary | ICD-10-CM | POA: Diagnosis not present

## 2015-06-06 DIAGNOSIS — R42 Dizziness and giddiness: Secondary | ICD-10-CM | POA: Diagnosis not present

## 2015-06-06 DIAGNOSIS — R0789 Other chest pain: Secondary | ICD-10-CM | POA: Diagnosis not present

## 2015-06-06 DIAGNOSIS — R51 Headache: Secondary | ICD-10-CM | POA: Diagnosis not present

## 2015-06-06 DIAGNOSIS — H8110 Benign paroxysmal vertigo, unspecified ear: Secondary | ICD-10-CM | POA: Diagnosis not present

## 2015-06-06 DIAGNOSIS — R110 Nausea: Secondary | ICD-10-CM | POA: Diagnosis not present

## 2015-06-07 DIAGNOSIS — Z794 Long term (current) use of insulin: Secondary | ICD-10-CM | POA: Diagnosis not present

## 2015-06-07 DIAGNOSIS — E119 Type 2 diabetes mellitus without complications: Secondary | ICD-10-CM | POA: Diagnosis not present

## 2015-06-07 DIAGNOSIS — R079 Chest pain, unspecified: Secondary | ICD-10-CM | POA: Diagnosis not present

## 2015-06-07 DIAGNOSIS — Z8673 Personal history of transient ischemic attack (TIA), and cerebral infarction without residual deficits: Secondary | ICD-10-CM | POA: Diagnosis not present

## 2015-06-07 DIAGNOSIS — G459 Transient cerebral ischemic attack, unspecified: Secondary | ICD-10-CM | POA: Diagnosis not present

## 2015-06-07 DIAGNOSIS — I10 Essential (primary) hypertension: Secondary | ICD-10-CM | POA: Diagnosis not present

## 2015-06-07 DIAGNOSIS — R42 Dizziness and giddiness: Secondary | ICD-10-CM | POA: Diagnosis not present

## 2015-06-07 DIAGNOSIS — H8110 Benign paroxysmal vertigo, unspecified ear: Secondary | ICD-10-CM | POA: Diagnosis not present

## 2015-06-08 DIAGNOSIS — R079 Chest pain, unspecified: Secondary | ICD-10-CM | POA: Diagnosis not present

## 2015-06-08 DIAGNOSIS — E119 Type 2 diabetes mellitus without complications: Secondary | ICD-10-CM | POA: Diagnosis not present

## 2015-06-08 DIAGNOSIS — I10 Essential (primary) hypertension: Secondary | ICD-10-CM | POA: Diagnosis not present

## 2015-06-08 DIAGNOSIS — Z794 Long term (current) use of insulin: Secondary | ICD-10-CM | POA: Diagnosis not present

## 2015-06-08 DIAGNOSIS — R42 Dizziness and giddiness: Secondary | ICD-10-CM | POA: Diagnosis not present

## 2015-06-08 DIAGNOSIS — Z8673 Personal history of transient ischemic attack (TIA), and cerebral infarction without residual deficits: Secondary | ICD-10-CM | POA: Diagnosis not present

## 2015-06-13 DIAGNOSIS — H832X1 Labyrinthine dysfunction, right ear: Secondary | ICD-10-CM | POA: Diagnosis not present

## 2015-06-13 DIAGNOSIS — R42 Dizziness and giddiness: Secondary | ICD-10-CM | POA: Diagnosis not present

## 2015-06-27 DIAGNOSIS — K5730 Diverticulosis of large intestine without perforation or abscess without bleeding: Secondary | ICD-10-CM | POA: Diagnosis not present

## 2015-06-27 DIAGNOSIS — K649 Unspecified hemorrhoids: Secondary | ICD-10-CM | POA: Diagnosis not present

## 2015-06-27 DIAGNOSIS — Z1211 Encounter for screening for malignant neoplasm of colon: Secondary | ICD-10-CM | POA: Diagnosis not present

## 2015-06-27 DIAGNOSIS — K229 Disease of esophagus, unspecified: Secondary | ICD-10-CM | POA: Diagnosis not present

## 2015-07-24 DIAGNOSIS — E119 Type 2 diabetes mellitus without complications: Secondary | ICD-10-CM | POA: Diagnosis not present

## 2015-07-24 DIAGNOSIS — R42 Dizziness and giddiness: Secondary | ICD-10-CM | POA: Diagnosis not present

## 2015-07-24 DIAGNOSIS — M542 Cervicalgia: Secondary | ICD-10-CM | POA: Diagnosis not present

## 2015-07-24 DIAGNOSIS — E785 Hyperlipidemia, unspecified: Secondary | ICD-10-CM | POA: Diagnosis not present

## 2015-08-18 DIAGNOSIS — H903 Sensorineural hearing loss, bilateral: Secondary | ICD-10-CM | POA: Diagnosis not present

## 2015-08-18 DIAGNOSIS — H81399 Other peripheral vertigo, unspecified ear: Secondary | ICD-10-CM | POA: Diagnosis not present

## 2015-09-24 DIAGNOSIS — I872 Venous insufficiency (chronic) (peripheral): Secondary | ICD-10-CM | POA: Diagnosis not present

## 2015-09-24 DIAGNOSIS — E119 Type 2 diabetes mellitus without complications: Secondary | ICD-10-CM | POA: Diagnosis not present

## 2015-10-24 DIAGNOSIS — E119 Type 2 diabetes mellitus without complications: Secondary | ICD-10-CM | POA: Diagnosis not present

## 2015-10-24 DIAGNOSIS — I10 Essential (primary) hypertension: Secondary | ICD-10-CM | POA: Diagnosis not present

## 2015-10-24 DIAGNOSIS — R42 Dizziness and giddiness: Secondary | ICD-10-CM | POA: Diagnosis not present

## 2015-10-24 DIAGNOSIS — E785 Hyperlipidemia, unspecified: Secondary | ICD-10-CM | POA: Diagnosis not present

## 2015-11-05 DIAGNOSIS — Z8673 Personal history of transient ischemic attack (TIA), and cerebral infarction without residual deficits: Secondary | ICD-10-CM | POA: Diagnosis not present

## 2015-11-05 DIAGNOSIS — I10 Essential (primary) hypertension: Secondary | ICD-10-CM | POA: Diagnosis not present

## 2015-11-05 DIAGNOSIS — J0111 Acute recurrent frontal sinusitis: Secondary | ICD-10-CM | POA: Diagnosis not present

## 2015-11-05 DIAGNOSIS — Z87891 Personal history of nicotine dependence: Secondary | ICD-10-CM | POA: Diagnosis not present

## 2015-11-05 DIAGNOSIS — R05 Cough: Secondary | ICD-10-CM | POA: Diagnosis not present

## 2015-11-05 DIAGNOSIS — R51 Headache: Secondary | ICD-10-CM | POA: Diagnosis not present

## 2015-11-05 DIAGNOSIS — Z7982 Long term (current) use of aspirin: Secondary | ICD-10-CM | POA: Diagnosis not present

## 2015-11-05 DIAGNOSIS — E119 Type 2 diabetes mellitus without complications: Secondary | ICD-10-CM | POA: Diagnosis not present

## 2015-11-05 DIAGNOSIS — E785 Hyperlipidemia, unspecified: Secondary | ICD-10-CM | POA: Diagnosis not present

## 2016-01-02 DIAGNOSIS — E119 Type 2 diabetes mellitus without complications: Secondary | ICD-10-CM | POA: Diagnosis not present

## 2016-01-02 DIAGNOSIS — I10 Essential (primary) hypertension: Secondary | ICD-10-CM | POA: Diagnosis not present

## 2016-01-02 DIAGNOSIS — Z8673 Personal history of transient ischemic attack (TIA), and cerebral infarction without residual deficits: Secondary | ICD-10-CM | POA: Diagnosis not present

## 2016-01-02 DIAGNOSIS — E785 Hyperlipidemia, unspecified: Secondary | ICD-10-CM | POA: Diagnosis not present

## 2016-01-02 DIAGNOSIS — S39012A Strain of muscle, fascia and tendon of lower back, initial encounter: Secondary | ICD-10-CM | POA: Diagnosis not present

## 2016-01-02 DIAGNOSIS — M545 Low back pain: Secondary | ICD-10-CM | POA: Diagnosis not present

## 2016-01-02 DIAGNOSIS — X58XXXA Exposure to other specified factors, initial encounter: Secondary | ICD-10-CM | POA: Diagnosis not present

## 2016-01-02 DIAGNOSIS — Z87891 Personal history of nicotine dependence: Secondary | ICD-10-CM | POA: Diagnosis not present

## 2016-01-20 DIAGNOSIS — R42 Dizziness and giddiness: Secondary | ICD-10-CM | POA: Diagnosis not present

## 2016-01-20 DIAGNOSIS — I10 Essential (primary) hypertension: Secondary | ICD-10-CM | POA: Diagnosis not present

## 2016-01-20 DIAGNOSIS — E785 Hyperlipidemia, unspecified: Secondary | ICD-10-CM | POA: Diagnosis not present

## 2016-01-20 DIAGNOSIS — E119 Type 2 diabetes mellitus without complications: Secondary | ICD-10-CM | POA: Diagnosis not present

## 2016-01-20 DIAGNOSIS — R21 Rash and other nonspecific skin eruption: Secondary | ICD-10-CM | POA: Diagnosis not present

## 2016-05-30 DIAGNOSIS — I10 Essential (primary) hypertension: Secondary | ICD-10-CM | POA: Diagnosis not present

## 2016-05-30 DIAGNOSIS — J0121 Acute recurrent ethmoidal sinusitis: Secondary | ICD-10-CM | POA: Diagnosis not present

## 2016-05-30 DIAGNOSIS — Z87891 Personal history of nicotine dependence: Secondary | ICD-10-CM | POA: Diagnosis not present

## 2016-05-30 DIAGNOSIS — E785 Hyperlipidemia, unspecified: Secondary | ICD-10-CM | POA: Diagnosis not present

## 2016-05-30 DIAGNOSIS — Z8673 Personal history of transient ischemic attack (TIA), and cerebral infarction without residual deficits: Secondary | ICD-10-CM | POA: Diagnosis not present

## 2016-05-30 DIAGNOSIS — R0789 Other chest pain: Secondary | ICD-10-CM | POA: Diagnosis not present

## 2016-05-30 DIAGNOSIS — R05 Cough: Secondary | ICD-10-CM | POA: Diagnosis not present

## 2016-05-30 DIAGNOSIS — R51 Headache: Secondary | ICD-10-CM | POA: Diagnosis not present

## 2016-05-30 DIAGNOSIS — Z7982 Long term (current) use of aspirin: Secondary | ICD-10-CM | POA: Diagnosis not present

## 2016-05-30 DIAGNOSIS — H905 Unspecified sensorineural hearing loss: Secondary | ICD-10-CM | POA: Diagnosis not present

## 2016-05-30 DIAGNOSIS — E119 Type 2 diabetes mellitus without complications: Secondary | ICD-10-CM | POA: Diagnosis not present

## 2016-06-09 DIAGNOSIS — E785 Hyperlipidemia, unspecified: Secondary | ICD-10-CM | POA: Diagnosis not present

## 2016-06-09 DIAGNOSIS — R531 Weakness: Secondary | ICD-10-CM | POA: Diagnosis not present

## 2016-06-09 DIAGNOSIS — E119 Type 2 diabetes mellitus without complications: Secondary | ICD-10-CM | POA: Diagnosis not present

## 2016-06-09 DIAGNOSIS — B350 Tinea barbae and tinea capitis: Secondary | ICD-10-CM | POA: Diagnosis not present

## 2016-06-09 DIAGNOSIS — I10 Essential (primary) hypertension: Secondary | ICD-10-CM | POA: Diagnosis not present

## 2016-10-02 DIAGNOSIS — Z7982 Long term (current) use of aspirin: Secondary | ICD-10-CM | POA: Diagnosis not present

## 2016-10-02 DIAGNOSIS — B354 Tinea corporis: Secondary | ICD-10-CM | POA: Diagnosis not present

## 2016-10-02 DIAGNOSIS — Z8673 Personal history of transient ischemic attack (TIA), and cerebral infarction without residual deficits: Secondary | ICD-10-CM | POA: Diagnosis not present

## 2016-10-02 DIAGNOSIS — I10 Essential (primary) hypertension: Secondary | ICD-10-CM | POA: Diagnosis not present

## 2016-10-02 DIAGNOSIS — E1165 Type 2 diabetes mellitus with hyperglycemia: Secondary | ICD-10-CM | POA: Diagnosis not present

## 2016-10-02 DIAGNOSIS — Z79899 Other long term (current) drug therapy: Secondary | ICD-10-CM | POA: Diagnosis not present

## 2016-10-02 DIAGNOSIS — E785 Hyperlipidemia, unspecified: Secondary | ICD-10-CM | POA: Diagnosis not present

## 2016-10-02 DIAGNOSIS — B370 Candidal stomatitis: Secondary | ICD-10-CM | POA: Diagnosis not present

## 2016-10-02 DIAGNOSIS — J029 Acute pharyngitis, unspecified: Secondary | ICD-10-CM | POA: Diagnosis not present

## 2016-10-06 DIAGNOSIS — R21 Rash and other nonspecific skin eruption: Secondary | ICD-10-CM | POA: Diagnosis not present

## 2016-10-06 DIAGNOSIS — E119 Type 2 diabetes mellitus without complications: Secondary | ICD-10-CM | POA: Diagnosis not present

## 2016-10-06 DIAGNOSIS — Z09 Encounter for follow-up examination after completed treatment for conditions other than malignant neoplasm: Secondary | ICD-10-CM | POA: Diagnosis not present

## 2016-10-06 DIAGNOSIS — Z125 Encounter for screening for malignant neoplasm of prostate: Secondary | ICD-10-CM | POA: Diagnosis not present

## 2016-10-06 DIAGNOSIS — E785 Hyperlipidemia, unspecified: Secondary | ICD-10-CM | POA: Diagnosis not present

## 2016-10-06 DIAGNOSIS — I10 Essential (primary) hypertension: Secondary | ICD-10-CM | POA: Diagnosis not present

## 2016-10-08 DIAGNOSIS — Z131 Encounter for screening for diabetes mellitus: Secondary | ICD-10-CM | POA: Diagnosis not present

## 2016-10-08 DIAGNOSIS — Z13220 Encounter for screening for lipoid disorders: Secondary | ICD-10-CM | POA: Diagnosis not present

## 2016-11-16 DIAGNOSIS — H401134 Primary open-angle glaucoma, bilateral, indeterminate stage: Secondary | ICD-10-CM | POA: Diagnosis not present

## 2016-11-16 DIAGNOSIS — H2513 Age-related nuclear cataract, bilateral: Secondary | ICD-10-CM | POA: Diagnosis not present

## 2016-11-16 DIAGNOSIS — H5203 Hypermetropia, bilateral: Secondary | ICD-10-CM | POA: Diagnosis not present

## 2016-11-16 DIAGNOSIS — H524 Presbyopia: Secondary | ICD-10-CM | POA: Diagnosis not present

## 2016-11-16 DIAGNOSIS — H25013 Cortical age-related cataract, bilateral: Secondary | ICD-10-CM | POA: Diagnosis not present

## 2016-11-16 DIAGNOSIS — Z794 Long term (current) use of insulin: Secondary | ICD-10-CM | POA: Diagnosis not present

## 2016-11-16 DIAGNOSIS — E119 Type 2 diabetes mellitus without complications: Secondary | ICD-10-CM | POA: Diagnosis not present

## 2016-12-13 DIAGNOSIS — E119 Type 2 diabetes mellitus without complications: Secondary | ICD-10-CM | POA: Diagnosis not present

## 2016-12-13 DIAGNOSIS — J309 Allergic rhinitis, unspecified: Secondary | ICD-10-CM | POA: Diagnosis not present

## 2016-12-13 DIAGNOSIS — J019 Acute sinusitis, unspecified: Secondary | ICD-10-CM | POA: Diagnosis not present

## 2016-12-13 DIAGNOSIS — R0981 Nasal congestion: Secondary | ICD-10-CM | POA: Diagnosis not present

## 2016-12-26 DIAGNOSIS — R079 Chest pain, unspecified: Secondary | ICD-10-CM | POA: Diagnosis not present

## 2016-12-26 DIAGNOSIS — H905 Unspecified sensorineural hearing loss: Secondary | ICD-10-CM | POA: Diagnosis not present

## 2016-12-26 DIAGNOSIS — I10 Essential (primary) hypertension: Secondary | ICD-10-CM | POA: Diagnosis not present

## 2016-12-26 DIAGNOSIS — R05 Cough: Secondary | ICD-10-CM | POA: Diagnosis not present

## 2017-01-12 DIAGNOSIS — R1313 Dysphagia, pharyngeal phase: Secondary | ICD-10-CM | POA: Diagnosis not present

## 2017-01-12 DIAGNOSIS — E119 Type 2 diabetes mellitus without complications: Secondary | ICD-10-CM | POA: Diagnosis not present

## 2017-01-12 DIAGNOSIS — I10 Essential (primary) hypertension: Secondary | ICD-10-CM | POA: Diagnosis not present

## 2017-01-12 DIAGNOSIS — R350 Frequency of micturition: Secondary | ICD-10-CM | POA: Diagnosis not present

## 2017-04-26 DIAGNOSIS — R5383 Other fatigue: Secondary | ICD-10-CM | POA: Diagnosis not present

## 2017-04-26 DIAGNOSIS — R51 Headache: Secondary | ICD-10-CM | POA: Diagnosis not present

## 2017-04-26 DIAGNOSIS — M791 Myalgia, unspecified site: Secondary | ICD-10-CM | POA: Diagnosis not present

## 2017-04-26 DIAGNOSIS — Z87891 Personal history of nicotine dependence: Secondary | ICD-10-CM | POA: Diagnosis not present

## 2017-04-26 DIAGNOSIS — J0101 Acute recurrent maxillary sinusitis: Secondary | ICD-10-CM | POA: Diagnosis not present

## 2017-05-06 DIAGNOSIS — E119 Type 2 diabetes mellitus without complications: Secondary | ICD-10-CM | POA: Diagnosis not present

## 2017-05-06 DIAGNOSIS — I10 Essential (primary) hypertension: Secondary | ICD-10-CM | POA: Diagnosis not present

## 2017-05-06 DIAGNOSIS — Z8673 Personal history of transient ischemic attack (TIA), and cerebral infarction without residual deficits: Secondary | ICD-10-CM | POA: Diagnosis not present

## 2017-05-06 DIAGNOSIS — J329 Chronic sinusitis, unspecified: Secondary | ICD-10-CM | POA: Diagnosis not present

## 2017-05-06 DIAGNOSIS — Z09 Encounter for follow-up examination after completed treatment for conditions other than malignant neoplasm: Secondary | ICD-10-CM | POA: Diagnosis not present

## 2017-06-01 DIAGNOSIS — R51 Headache: Secondary | ICD-10-CM | POA: Diagnosis not present

## 2017-06-01 DIAGNOSIS — I10 Essential (primary) hypertension: Secondary | ICD-10-CM | POA: Diagnosis not present

## 2017-06-22 DIAGNOSIS — S0990XA Unspecified injury of head, initial encounter: Secondary | ICD-10-CM | POA: Diagnosis not present

## 2017-06-22 DIAGNOSIS — R51 Headache: Secondary | ICD-10-CM | POA: Diagnosis not present

## 2017-06-22 DIAGNOSIS — S161XXA Strain of muscle, fascia and tendon at neck level, initial encounter: Secondary | ICD-10-CM | POA: Diagnosis not present

## 2017-06-22 DIAGNOSIS — M542 Cervicalgia: Secondary | ICD-10-CM | POA: Diagnosis not present

## 2017-06-22 DIAGNOSIS — S199XXA Unspecified injury of neck, initial encounter: Secondary | ICD-10-CM | POA: Diagnosis not present

## 2017-06-22 DIAGNOSIS — M546 Pain in thoracic spine: Secondary | ICD-10-CM | POA: Diagnosis not present

## 2017-07-20 DIAGNOSIS — Z8673 Personal history of transient ischemic attack (TIA), and cerebral infarction without residual deficits: Secondary | ICD-10-CM | POA: Diagnosis not present

## 2017-07-20 DIAGNOSIS — N529 Male erectile dysfunction, unspecified: Secondary | ICD-10-CM | POA: Diagnosis not present

## 2017-07-20 DIAGNOSIS — E785 Hyperlipidemia, unspecified: Secondary | ICD-10-CM | POA: Diagnosis not present

## 2017-07-20 DIAGNOSIS — R1313 Dysphagia, pharyngeal phase: Secondary | ICD-10-CM | POA: Diagnosis not present

## 2017-07-20 DIAGNOSIS — R001 Bradycardia, unspecified: Secondary | ICD-10-CM | POA: Diagnosis not present

## 2017-07-20 DIAGNOSIS — E119 Type 2 diabetes mellitus without complications: Secondary | ICD-10-CM | POA: Diagnosis not present

## 2017-09-08 DIAGNOSIS — E119 Type 2 diabetes mellitus without complications: Secondary | ICD-10-CM | POA: Diagnosis not present

## 2017-09-08 DIAGNOSIS — R001 Bradycardia, unspecified: Secondary | ICD-10-CM | POA: Diagnosis not present

## 2017-09-08 DIAGNOSIS — E785 Hyperlipidemia, unspecified: Secondary | ICD-10-CM | POA: Diagnosis not present

## 2017-09-09 DIAGNOSIS — R9431 Abnormal electrocardiogram [ECG] [EKG]: Secondary | ICD-10-CM | POA: Diagnosis not present

## 2017-09-09 DIAGNOSIS — I517 Cardiomegaly: Secondary | ICD-10-CM | POA: Diagnosis not present

## 2017-10-11 ENCOUNTER — Encounter (HOSPITAL_BASED_OUTPATIENT_CLINIC_OR_DEPARTMENT_OTHER): Payer: Self-pay

## 2017-10-11 ENCOUNTER — Other Ambulatory Visit: Payer: Self-pay

## 2017-10-11 ENCOUNTER — Emergency Department (HOSPITAL_BASED_OUTPATIENT_CLINIC_OR_DEPARTMENT_OTHER)
Admission: EM | Admit: 2017-10-11 | Discharge: 2017-10-12 | Disposition: A | Discharge status: Home / Self Care | Payer: PPO | Attending: Emergency Medicine | Admitting: Emergency Medicine

## 2017-10-11 DIAGNOSIS — I10 Essential (primary) hypertension: Secondary | ICD-10-CM | POA: Insufficient documentation

## 2017-10-11 DIAGNOSIS — Z8673 Personal history of transient ischemic attack (TIA), and cerebral infarction without residual deficits: Secondary | ICD-10-CM | POA: Insufficient documentation

## 2017-10-11 DIAGNOSIS — Z87891 Personal history of nicotine dependence: Secondary | ICD-10-CM | POA: Insufficient documentation

## 2017-10-11 DIAGNOSIS — Z794 Long term (current) use of insulin: Secondary | ICD-10-CM | POA: Diagnosis not present

## 2017-10-11 DIAGNOSIS — E119 Type 2 diabetes mellitus without complications: Secondary | ICD-10-CM | POA: Diagnosis not present

## 2017-10-11 DIAGNOSIS — Z7982 Long term (current) use of aspirin: Secondary | ICD-10-CM | POA: Diagnosis not present

## 2017-10-11 DIAGNOSIS — R600 Localized edema: Secondary | ICD-10-CM | POA: Insufficient documentation

## 2017-10-11 HISTORY — DX: Pure hypercholesterolemia, unspecified: E78.00

## 2017-10-11 NOTE — ED Triage Notes (Signed)
C/o bilat leg swelling x 3 days-denies injury-NAD-steady gait

## 2017-10-12 LAB — CBC WITH DIFFERENTIAL/PLATELET
Basophils Absolute: 0 10*3/uL (ref 0.0–0.1)
Basophils Relative: 1 %
Eosinophils Absolute: 0.3 10*3/uL (ref 0.0–0.7)
Eosinophils Relative: 4 %
HCT: 35 % — ABNORMAL LOW (ref 39.0–52.0)
Hemoglobin: 12.3 g/dL — ABNORMAL LOW (ref 13.0–17.0)
Lymphocytes Relative: 25 %
Lymphs Abs: 1.7 10*3/uL (ref 0.7–4.0)
MCH: 30.6 pg (ref 26.0–34.0)
MCHC: 35.1 g/dL (ref 30.0–36.0)
MCV: 87.1 fL (ref 78.0–100.0)
Monocytes Absolute: 0.7 10*3/uL (ref 0.1–1.0)
Monocytes Relative: 10 %
Neutro Abs: 4.1 10*3/uL (ref 1.7–7.7)
Neutrophils Relative %: 60 %
Platelets: 155 10*3/uL (ref 150–400)
RBC: 4.02 MIL/uL — ABNORMAL LOW (ref 4.22–5.81)
RDW: 12.9 % (ref 11.5–15.5)
WBC: 6.8 10*3/uL (ref 4.0–10.5)

## 2017-10-12 LAB — BASIC METABOLIC PANEL
Anion gap: 8 (ref 5–15)
BUN: 10 mg/dL (ref 6–20)
CO2: 26 mmol/L (ref 22–32)
Calcium: 8.8 mg/dL — ABNORMAL LOW (ref 8.9–10.3)
Chloride: 104 mmol/L (ref 101–111)
Creatinine, Ser: 1.04 mg/dL (ref 0.61–1.24)
GFR calc Af Amer: >60 mL/min (ref >60)
GFR calc non Af Amer: >60 mL/min (ref >60)
Glucose, Bld: 109 mg/dL — ABNORMAL HIGH (ref 65–99)
Potassium: 3.6 mmol/L (ref 3.5–5.1)
Sodium: 138 mmol/L (ref 135–145)

## 2017-10-12 MED ORDER — FUROSEMIDE 20 MG PO TABS: 20.0000 mg | ORAL_TABLET | Freq: Every day | ORAL | 0 refills | Status: DC

## 2017-10-12 NOTE — ED Notes (Signed)
ED Provider at bedside. 

## 2017-10-12 NOTE — Discharge Instructions (Addendum)
Begin taking Lasix as prescribed.  Elevate your feet whenever possible for the next several days.  Return to the emergency department if you develop difficulty breathing, chest pains, or other new and concerning symptoms.

## 2017-10-12 NOTE — ED Provider Notes (Signed)
[facility] EMERGENCY DEPARTMENT Provider Note   CSN: [id number] Arrival date & time: 10/11/17  2206     History   Chief Complaint Chief Complaint  Patient presents with  . Leg Swelling    HPI BRITNEY CAPTAIN is a 72 y.o. male.  Patient is a 71 year old male with history of diabetes presenting with complaints of leg swelling.  This is been ongoing for the past 2 days.  He does report being on his feet more than normal doing yard work.  He denies any chest pain, shortness of breath.  He denies any fevers or chills.  It is somewhat improved when he elevates his feet.  The history is provided by the patient.    Past Medical History:  Diagnosis Date  . Arthritis    RIGHT KNEE  . Diabetes mellitus   . GERD (gastroesophageal reflux disease)   . High cholesterol   . Hypertension   . Stroke [facility])    JUNE 2013--HAD TERRIBLE H/A--NO DEFICITS  . Trouble swallowing     Patient Active Problem List   Diagnosis Date Noted  . Stroke (HCC) 10/19/2011  . DM (diabetes mellitus) (HCC) 10/19/2011  . HTN (hypertension) 10/19/2011  . GERD (gastroesophageal reflux disease) 10/19/2011    Past Surgical History:  Procedure Laterality Date  . BALLOON DILATION  04/12/2012   Procedure: BALLOON DILATION;  Surgeon: Willis Modena, MD;  Location: [facility] ENDOSCOPY;  Service: Endoscopy;  Laterality: N/A;  . CIRCUMCISION    . ESOPHAGOGASTRODUODENOSCOPY (EGD) WITH PROPOFOL  04/12/2012   Procedure: ESOPHAGOGASTRODUODENOSCOPY (EGD) WITH PROPOFOL;  Surgeon: Willis Modena, MD;  Location: [facility] ENDOSCOPY;  Service: Endoscopy;  Laterality: N/A;        Home Medications    Prior to Admission medications   Medication Sig Start Date End Date Taking? Authorizing Provider  aspirin 81 MG tablet Take 81 mg by mouth daily.    [provider]  cloNIDine (CATAPRES) 0.1 MG tablet Take 2 tablets (0.2 mg total) by mouth 2 (two) times daily. 07/29/14   Rolland Porter, MD  glipiZIDE (GLUCOTROL) 5 MG  tablet Take 2.5 mg by mouth every morning. 1/2 TABLET DAILY    [provider]  insulin glargine (LANTUS) 100 UNIT/ML injection Inject 10 Units into the skin at bedtime.    [provider]  lisinopril (PRINIVIL,ZESTRIL) 5 MG tablet Take 5 mg by mouth every morning.     [provider]  LOSARTAN POTASSIUM-HCTZ PO Take by mouth.    [provider]  metFORMIN (GLUCOPHAGE) 500 MG tablet Take 1,000 mg by mouth 2 (two) times daily with a meal.    [provider]  naproxen (NAPROSYN) 375 MG tablet Take 1 tablet (375 mg total) by mouth 2 (two) times daily. 08/10/14   Tilden Fossa, MD  omeprazole (PRILOSEC) 40 MG capsule Take 40 mg by mouth 2 (two) times daily. BEFORE A MEAL    [provider]  pravastatin (PRAVACHOL) 40 MG tablet Take 40 mg by mouth every morning.  10/20/11 10/19/12  Vassie Loll, MD  sucralfate (CARAFATE) 1 GM/10ML suspension Take 1 g by mouth 2 (two) times daily. 04/04/12   Palumbo, April, MD  diphenhydrAMINE (BENADRYL) 50 MG capsule Take 1 capsule (50 mg total) by mouth once. 01/28/14 08/10/14  MabeLatanya Maudlin, MD    Family History No family history on file.  Social History Social History   Tobacco Use  . Smoking status: Former Games developer  . Smokeless tobacco: Never Used  Substance Use Topics  .  Alcohol use: No  . Drug use: No     Allergies   Patient has no known allergies.   Review of Systems Review of Systems  All other systems reviewed and are negative.    Physical Exam Updated Vital Signs BP (!) 184/100 (BP Location: Right Arm)   Pulse (!) 51   Temp 97.7 F (36.5 C) (Oral)   Resp 16   Ht  (1.702 m)   Wt 85.5 kg (188 lb 7.9 oz)   SpO2 100%   BMI 29.52 kg/m   Physical Exam  Constitutional: He is oriented to person, place, and time. He appears well-developed and well-nourished. No distress.  HENT:  Head: Normocephalic and atraumatic.  Mouth/Throat: Oropharynx is clear and moist.  Neck: Normal range  of motion. Neck supple.  Cardiovascular: Normal rate and regular rhythm. Exam reveals no friction rub.  No murmur heard. Pulmonary/Chest: Effort normal and breath sounds normal. No respiratory distress. He has no wheezes. He has no rales.  Abdominal: Soft. Bowel sounds are normal. He exhibits no distension. There is no tenderness.  Musculoskeletal: Normal range of motion. He exhibits edema.  There is trace edema of both lower extremities and feet.  DP pulses are easily palpable.  Neurological: He is alert and oriented to person, place, and time. Coordination normal.  Skin: Skin is warm and dry. He is not diaphoretic.  Nursing note and vitals reviewed.    ED Treatments / Results  Labs (all labs ordered are listed, but only abnormal results are displayed) Labs Reviewed  BASIC METABOLIC PANEL  CBC WITH DIFFERENTIAL/PLATELET    EKG None  Radiology No results found.  Procedures Procedures (including critical care time)  Medications Ordered in ED Medications - No data to display   Initial Impression / Assessment and Plan / ED Course  I have reviewed the triage vital signs and the nursing notes.  Pertinent labs & imaging results that were available during my care of the patient were reviewed by me and considered in my medical decision making (see chart for details).  Patient presents with leg swelling, the etiology of which I suspect dependent edema.  He has been on his feet doing yard work recently.  His work-up is essentially unremarkable including EKG, chest x-ray, and laboratory studies.  I will prescribe a small quantity of Lasix and have him elevate his feet.  He is to follow-up with his primary doctor if his swelling persists.  Final Clinical Impressions(s) / ED Diagnoses   Final diagnoses:  None    ED Discharge Orders    None       Geoffery Lyons, MD 10/12/17 [phone]

## 2017-10-21 DIAGNOSIS — M549 Dorsalgia, unspecified: Secondary | ICD-10-CM | POA: Diagnosis not present

## 2017-10-21 DIAGNOSIS — E119 Type 2 diabetes mellitus without complications: Secondary | ICD-10-CM | POA: Diagnosis not present

## 2017-10-21 DIAGNOSIS — I10 Essential (primary) hypertension: Secondary | ICD-10-CM | POA: Diagnosis not present

## 2017-10-21 DIAGNOSIS — H9190 Unspecified hearing loss, unspecified ear: Secondary | ICD-10-CM | POA: Diagnosis not present

## 2018-02-08 DIAGNOSIS — R39198 Other difficulties with micturition: Secondary | ICD-10-CM | POA: Diagnosis not present

## 2018-02-08 DIAGNOSIS — E119 Type 2 diabetes mellitus without complications: Secondary | ICD-10-CM | POA: Diagnosis not present

## 2018-02-08 DIAGNOSIS — R350 Frequency of micturition: Secondary | ICD-10-CM | POA: Diagnosis not present

## 2018-02-08 DIAGNOSIS — I10 Essential (primary) hypertension: Secondary | ICD-10-CM | POA: Diagnosis not present

## 2018-03-01 DIAGNOSIS — Z09 Encounter for follow-up examination after completed treatment for conditions other than malignant neoplasm: Secondary | ICD-10-CM | POA: Diagnosis not present

## 2018-03-01 DIAGNOSIS — I10 Essential (primary) hypertension: Secondary | ICD-10-CM | POA: Diagnosis not present

## 2018-03-08 DIAGNOSIS — N401 Enlarged prostate with lower urinary tract symptoms: Secondary | ICD-10-CM | POA: Diagnosis not present

## 2018-03-08 DIAGNOSIS — N138 Other obstructive and reflux uropathy: Secondary | ICD-10-CM | POA: Diagnosis not present

## 2018-03-08 DIAGNOSIS — R339 Retention of urine, unspecified: Secondary | ICD-10-CM | POA: Diagnosis not present

## 2018-03-20 DIAGNOSIS — L853 Xerosis cutis: Secondary | ICD-10-CM | POA: Diagnosis not present

## 2018-03-20 DIAGNOSIS — N401 Enlarged prostate with lower urinary tract symptoms: Secondary | ICD-10-CM | POA: Diagnosis not present

## 2018-03-20 DIAGNOSIS — N138 Other obstructive and reflux uropathy: Secondary | ICD-10-CM | POA: Diagnosis not present

## 2018-03-20 DIAGNOSIS — E119 Type 2 diabetes mellitus without complications: Secondary | ICD-10-CM | POA: Diagnosis not present

## 2018-03-20 DIAGNOSIS — L298 Other pruritus: Secondary | ICD-10-CM | POA: Diagnosis not present

## 2018-03-20 DIAGNOSIS — R21 Rash and other nonspecific skin eruption: Secondary | ICD-10-CM | POA: Diagnosis not present

## 2018-03-21 DIAGNOSIS — A419 Sepsis, unspecified organism: Secondary | ICD-10-CM | POA: Diagnosis not present

## 2018-03-21 DIAGNOSIS — Z960 Presence of urogenital implants: Secondary | ICD-10-CM | POA: Diagnosis not present

## 2018-03-21 DIAGNOSIS — R42 Dizziness and giddiness: Secondary | ICD-10-CM | POA: Diagnosis not present

## 2018-03-21 DIAGNOSIS — J189 Pneumonia, unspecified organism: Secondary | ICD-10-CM | POA: Diagnosis not present

## 2018-03-21 DIAGNOSIS — J181 Lobar pneumonia, unspecified organism: Secondary | ICD-10-CM | POA: Diagnosis not present

## 2018-03-21 DIAGNOSIS — R4702 Dysphasia: Secondary | ICD-10-CM | POA: Diagnosis not present

## 2018-03-21 DIAGNOSIS — K219 Gastro-esophageal reflux disease without esophagitis: Secondary | ICD-10-CM | POA: Diagnosis not present

## 2018-03-21 DIAGNOSIS — T83511A Infection and inflammatory reaction due to indwelling urethral catheter, initial encounter: Secondary | ICD-10-CM | POA: Diagnosis not present

## 2018-03-21 DIAGNOSIS — N390 Urinary tract infection, site not specified: Secondary | ICD-10-CM | POA: Diagnosis not present

## 2018-03-21 DIAGNOSIS — R6889 Other general symptoms and signs: Secondary | ICD-10-CM | POA: Diagnosis not present

## 2018-03-21 DIAGNOSIS — E872 Acidosis: Secondary | ICD-10-CM | POA: Diagnosis not present

## 2018-03-21 DIAGNOSIS — J168 Pneumonia due to other specified infectious organisms: Secondary | ICD-10-CM | POA: Diagnosis not present

## 2018-03-21 DIAGNOSIS — E785 Hyperlipidemia, unspecified: Secondary | ICD-10-CM | POA: Diagnosis not present

## 2018-03-21 DIAGNOSIS — R1310 Dysphagia, unspecified: Secondary | ICD-10-CM | POA: Diagnosis not present

## 2018-03-21 DIAGNOSIS — R110 Nausea: Secondary | ICD-10-CM | POA: Diagnosis not present

## 2018-03-21 DIAGNOSIS — Z7982 Long term (current) use of aspirin: Secondary | ICD-10-CM | POA: Diagnosis not present

## 2018-03-21 DIAGNOSIS — R05 Cough: Secondary | ICD-10-CM | POA: Diagnosis not present

## 2018-03-21 DIAGNOSIS — N3000 Acute cystitis without hematuria: Secondary | ICD-10-CM | POA: Diagnosis not present

## 2018-03-21 DIAGNOSIS — R319 Hematuria, unspecified: Secondary | ICD-10-CM | POA: Diagnosis not present

## 2018-03-21 DIAGNOSIS — J151 Pneumonia due to Pseudomonas: Secondary | ICD-10-CM | POA: Diagnosis not present

## 2018-03-21 DIAGNOSIS — E1165 Type 2 diabetes mellitus with hyperglycemia: Secondary | ICD-10-CM | POA: Diagnosis not present

## 2018-03-21 DIAGNOSIS — Z8673 Personal history of transient ischemic attack (TIA), and cerebral infarction without residual deficits: Secondary | ICD-10-CM | POA: Diagnosis not present

## 2018-03-21 DIAGNOSIS — Z79899 Other long term (current) drug therapy: Secondary | ICD-10-CM | POA: Diagnosis not present

## 2018-03-21 DIAGNOSIS — Z87891 Personal history of nicotine dependence: Secondary | ICD-10-CM | POA: Diagnosis not present

## 2018-03-21 DIAGNOSIS — K5900 Constipation, unspecified: Secondary | ICD-10-CM | POA: Diagnosis not present

## 2018-03-21 DIAGNOSIS — R1084 Generalized abdominal pain: Secondary | ICD-10-CM | POA: Diagnosis not present

## 2018-03-21 DIAGNOSIS — I10 Essential (primary) hypertension: Secondary | ICD-10-CM | POA: Diagnosis not present

## 2018-03-21 DIAGNOSIS — R1312 Dysphagia, oropharyngeal phase: Secondary | ICD-10-CM | POA: Diagnosis not present

## 2018-03-21 DIAGNOSIS — Z794 Long term (current) use of insulin: Secondary | ICD-10-CM | POA: Diagnosis not present

## 2018-03-21 DIAGNOSIS — R7881 Bacteremia: Secondary | ICD-10-CM | POA: Diagnosis not present

## 2018-03-27 ENCOUNTER — Other Ambulatory Visit: Payer: Self-pay | Admitting: Pharmacist

## 2018-03-27 DIAGNOSIS — Z8701 Personal history of pneumonia (recurrent): Secondary | ICD-10-CM | POA: Diagnosis not present

## 2018-03-27 DIAGNOSIS — R300 Dysuria: Secondary | ICD-10-CM | POA: Diagnosis not present

## 2018-03-27 NOTE — Patient Outreach (Signed)
[facility]) Care Management  03/27/2018  SHEA KAPUR 20-May-1945 [id number]   71 year old male outreached by [facility] services for a 30 day post discharge medication review.  PMHx includes, but not limited to, diabetes,   Unsuccessful telephone call attempt #1 to patient.  Note routed to clinical pharmacist, Berlin Hun.   Gwynneth Albright, Ilda Basset D PGY1 Pharmacy Resident  Phone [phone] 03/27/2018   11:35 AM

## 2018-03-28 ENCOUNTER — Other Ambulatory Visit: Payer: Self-pay

## 2018-03-28 NOTE — Patient Outreach (Signed)
[facility]) Care Management  03/28/2018  Aaron GrandchildRonald L Blevins [birth year] [id number]   Referral Date: 03/28/18 Referral Source: HTA report Date of Admission: 03/21/18 Diagnosis: Pneumonia/sepsis Date of Discharge: 05/24/17 [facility] Insurance: HTA  Outreach attempt: No answer.  HIPAA. Compliant voice message left.    Plan: RN CM will attempt again within 4 business days and send letter.     Bary Lericheionne J Rhianne Soman, RN, MSN [facility] Care [facility] Care Management Coordinator Direct Line [phone] Toll Free: [phone]  Fax: [phone]

## 2018-03-29 ENCOUNTER — Other Ambulatory Visit: Payer: Self-pay

## 2018-03-29 NOTE — Patient Outreach (Signed)
[facility]) Care Management  03/29/2018  Aaron GrandchildRonald L Blevins 1945-12-30 [id number]   Referral Date: 03/28/18 Referral Source: HTA report Date of Admission: 03/21/18 Diagnosis: Pneumonia/sepsis Date of Discharge: 05/24/17 [facility] Insurance: HTA  Outreach attempt: No answer.  HIPAA. Compliant voice message left.    Plan: RN CM will attempt again within 4 business days.  [name], RN, MSN [facility] Care Management Care Management Coordinator Direct Line [phone] Cell [phone] Toll Free: [phone]  Fax: [phone]

## 2018-03-30 ENCOUNTER — Other Ambulatory Visit: Payer: Self-pay

## 2018-03-30 ENCOUNTER — Ambulatory Visit: Payer: Self-pay

## 2018-03-30 NOTE — Patient Outreach (Signed)
[facility]) Care Management  [facility]   03/30/2018  Etta GrandchildRonald L Blevins 01-18-1946 [id number]  Reason for referral: 30 day post discharge medication review  Current insurance:HTA   PMHx: hypetension, h/o stroke, GERD, diabetes, hyperlipidemia and vertigo  Successful outreach call to [name]'s wife, Aaron Blevins. HIPAA identifiers verified.   HPI:  Wife reports that Aaron Blevins is not feeling well.  He finishes his last day of antibitics for his UTI tomorrow and he is still having painful urination.  She reports that he is very weak and can barely walk to the  Bathroom.  She states that he checks his glucose daily and reported to her that his "sugar was high" yesterday, but she does not know the value.  Wife states he was supposed to call PCP if not feeling better.   Objective: Lab Results  Component Value Date   CREATININE 1.04 10/12/2017   CREATININE 1.20 07/29/2014   CREATININE 1.00 02/26/2014    Lab Results  Component Value Date   HGBA1C 5.9 (H) 10/19/2011    Lipid Panel     Component Value Date/Time   CHOL 144 10/19/2011 0725   TRIG 76 10/19/2011 0725   HDL 40 10/19/2011 0725   CHOLHDL 3.6 10/19/2011 0725   VLDL 15 10/19/2011 0725   LDLCALC 89 10/19/2011 0725    BP Readings from Last 3 Encounters:  10/12/17 (!) 184/95  08/10/14 161/89  07/29/14 158/89    No Known Allergies  Medications Reviewed Today    Reviewed by Hurley CiscoMendenhall, Jacqualyn Sedgwick D, RPH (Pharmacist) on 03/30/18 at 0933  Med List Status: <None>  Medication Order Taking? Sig Documenting Provider Last Dose Status Informant  amLODipine (NORVASC) 10 MG tablet [id number] Yes Take 10 mg by mouth daily. [provider] Taking Active Spouse/Significant Other  aspirin 81 MG tablet [id number] Yes Take 81 mg by mouth daily. [provider] Taking Active   insulin glargine (LANTUS) 100 UNIT/ML injection [id number] Yes Inject 15 Units into the skin at bedtime.  [provider]  Taking Active Spouse/Significant Other  ketoconazole (NIZORAL) 2 % cream [id number] Yes Apply 1 application topically daily. [provider] Taking Active   levofloxacin (LEVAQUIN) 750 MG tablet [id number] Yes Take 750 mg by mouth daily. Ends 03/31/18 [provider] Taking Active Spouse/Significant Other  meclizine (ANTIVERT) 12.5 MG tablet [id number] Yes Take 12.5 mg by mouth daily as needed for dizziness. [provider] Taking Active   metFORMIN (GLUCOPHAGE-XR) 500 MG 24 hr tablet [id number] Yes Take 500 mg by mouth 2 (two) times daily with a meal. [provider] Taking Active Spouse/Significant Other        Discontinued 03/30/18 0933 (Completed Course)   pravastatin (PRAVACHOL) 40 MG tablet [id number] Yes Take 40 mg by mouth daily. [provider]  Active Spouse/Significant Other          ASSESSMENT: Date Discharged from Hospital: 03/24/18 Date Medication Reconciliation Performed: 03/30/2018  New Medications at Discharge:   levofloxacin  Patient was recently discharged from hospital and all medications have been reviewed  Drugs sorted by system:  Cardiovascular:amlodipine, aspirin 81 mg, pravastatin  Endocrine: insulin glargine, metformin  Topical: ketoconazole cream  Infectious Diseases:levofloxacin,   Miscellaneous: meclizine  Gaps in therapy:  Patient with DM and not on an ACEI or ARB for nephro-protection.  Wife reports he had been on lisinopril and losartan in the past, but was discontinued.  Medications to avoid in the elderly: Per the Beers List, meclizine is highly anticholinergic and  clearance is reduced with advanced age. Risk of confusion, dry mouth, constipation and other anticholinergic effects or toxicity may occur.  There is strong evidence to avoid use in the elderly.   Other issues noted:   Wife reports that patient is out of pravastatin.  Refill called into [facility] and his daughter will pick up today.  PCP  office notified that patient has not improved and an appointment was scheduled for 11 am tomorrow.   PLAN: Route note to PCP, Seth Bake, FNP.   Berlin Hun, PharmD Clinical Pharmacist [facility] [phone]

## 2018-03-30 NOTE — Patient Outreach (Signed)
[facility]) Care Management  03/30/2018  Aaron GrandchildRonald L Rotert December 31, 1945 [id number]   Referral Date:03/28/18 Referral Source:HTA report Date of Admission:03/21/18 Diagnosis:Pneumonia/sepsis Date of Discharge:05/24/17 [facility] Insurance:HTA  Outreach attempt: Male answered stating patient not available.  CM contact information given for return call.  Plan: RN CM will wait return call.  If no return call will close case.   [name], RN, MSN [facility] Care Management Care Management Coordinator Direct Line [phone] Cell [phone] Toll Free: [phone]  Fax: [phone]

## 2018-03-31 DIAGNOSIS — R5383 Other fatigue: Secondary | ICD-10-CM | POA: Diagnosis not present

## 2018-03-31 DIAGNOSIS — Z8701 Personal history of pneumonia (recurrent): Secondary | ICD-10-CM | POA: Diagnosis not present

## 2018-03-31 DIAGNOSIS — R634 Abnormal weight loss: Secondary | ICD-10-CM | POA: Diagnosis not present

## 2018-03-31 DIAGNOSIS — Z09 Encounter for follow-up examination after completed treatment for conditions other than malignant neoplasm: Secondary | ICD-10-CM | POA: Diagnosis not present

## 2018-03-31 DIAGNOSIS — R531 Weakness: Secondary | ICD-10-CM | POA: Diagnosis not present

## 2018-04-07 DIAGNOSIS — N138 Other obstructive and reflux uropathy: Secondary | ICD-10-CM | POA: Diagnosis not present

## 2018-04-07 DIAGNOSIS — N401 Enlarged prostate with lower urinary tract symptoms: Secondary | ICD-10-CM | POA: Diagnosis not present

## 2018-04-12 ENCOUNTER — Other Ambulatory Visit: Payer: Self-pay

## 2018-04-12 NOTE — Patient Outreach (Signed)
[facility]) Care Management  04/12/2018  Etta GrandchildRonald L Crymes 03-30-1946 [id number]   Multiple attempts to establish contact with patient without success. No response from letter mailed to patient.   Plan: RN CM will close case at this time.   Bary Lericheionne J Veverly Larimer, RN, MSN [facility] Care Management Care Management Coordinator Direct Line [phone] Cell [phone] Toll Free: [phone]  Fax: [phone]

## 2018-05-14 DIAGNOSIS — E119 Type 2 diabetes mellitus without complications: Secondary | ICD-10-CM | POA: Diagnosis not present

## 2018-05-14 DIAGNOSIS — R5383 Other fatigue: Secondary | ICD-10-CM | POA: Diagnosis not present

## 2018-05-14 DIAGNOSIS — R350 Frequency of micturition: Secondary | ICD-10-CM | POA: Diagnosis not present

## 2018-05-15 DIAGNOSIS — I872 Venous insufficiency (chronic) (peripheral): Secondary | ICD-10-CM | POA: Diagnosis not present

## 2018-05-15 DIAGNOSIS — E119 Type 2 diabetes mellitus without complications: Secondary | ICD-10-CM | POA: Diagnosis not present

## 2018-06-14 DIAGNOSIS — E785 Hyperlipidemia, unspecified: Secondary | ICD-10-CM | POA: Diagnosis not present

## 2018-06-14 DIAGNOSIS — E1165 Type 2 diabetes mellitus with hyperglycemia: Secondary | ICD-10-CM | POA: Diagnosis not present

## 2018-06-14 DIAGNOSIS — I10 Essential (primary) hypertension: Secondary | ICD-10-CM | POA: Diagnosis not present

## 2018-06-14 DIAGNOSIS — Z0000 Encounter for general adult medical examination without abnormal findings: Secondary | ICD-10-CM | POA: Diagnosis not present

## 2018-06-14 DIAGNOSIS — H81399 Other peripheral vertigo, unspecified ear: Secondary | ICD-10-CM | POA: Diagnosis not present

## 2018-06-14 DIAGNOSIS — Z1159 Encounter for screening for other viral diseases: Secondary | ICD-10-CM | POA: Diagnosis not present

## 2018-06-14 DIAGNOSIS — Z794 Long term (current) use of insulin: Secondary | ICD-10-CM | POA: Diagnosis not present

## 2018-06-14 DIAGNOSIS — Z125 Encounter for screening for malignant neoplasm of prostate: Secondary | ICD-10-CM | POA: Diagnosis not present

## 2018-06-14 DIAGNOSIS — Z23 Encounter for immunization: Secondary | ICD-10-CM | POA: Diagnosis not present

## 2018-06-14 DIAGNOSIS — Z87891 Personal history of nicotine dependence: Secondary | ICD-10-CM | POA: Diagnosis not present

## 2018-06-14 DIAGNOSIS — K219 Gastro-esophageal reflux disease without esophagitis: Secondary | ICD-10-CM | POA: Diagnosis not present

## 2018-06-14 DIAGNOSIS — H903 Sensorineural hearing loss, bilateral: Secondary | ICD-10-CM | POA: Diagnosis not present

## 2018-06-21 DIAGNOSIS — Z125 Encounter for screening for malignant neoplasm of prostate: Secondary | ICD-10-CM | POA: Diagnosis not present

## 2018-06-21 DIAGNOSIS — E119 Type 2 diabetes mellitus without complications: Secondary | ICD-10-CM | POA: Diagnosis not present

## 2018-06-21 DIAGNOSIS — E785 Hyperlipidemia, unspecified: Secondary | ICD-10-CM | POA: Diagnosis not present

## 2018-06-21 DIAGNOSIS — I10 Essential (primary) hypertension: Secondary | ICD-10-CM | POA: Diagnosis not present

## 2018-06-21 DIAGNOSIS — Z1159 Encounter for screening for other viral diseases: Secondary | ICD-10-CM | POA: Diagnosis not present

## 2018-07-06 DIAGNOSIS — J101 Influenza due to other identified influenza virus with other respiratory manifestations: Secondary | ICD-10-CM | POA: Diagnosis not present

## 2018-07-06 DIAGNOSIS — R509 Fever, unspecified: Secondary | ICD-10-CM | POA: Diagnosis not present

## 2018-07-06 DIAGNOSIS — R05 Cough: Secondary | ICD-10-CM | POA: Diagnosis not present

## 2018-07-17 DIAGNOSIS — H5203 Hypermetropia, bilateral: Secondary | ICD-10-CM | POA: Diagnosis not present

## 2018-07-17 DIAGNOSIS — H2513 Age-related nuclear cataract, bilateral: Secondary | ICD-10-CM | POA: Diagnosis not present

## 2018-07-17 DIAGNOSIS — E119 Type 2 diabetes mellitus without complications: Secondary | ICD-10-CM | POA: Diagnosis not present

## 2018-07-17 DIAGNOSIS — H52202 Unspecified astigmatism, left eye: Secondary | ICD-10-CM | POA: Diagnosis not present

## 2018-07-17 DIAGNOSIS — H524 Presbyopia: Secondary | ICD-10-CM | POA: Diagnosis not present

## 2018-07-17 DIAGNOSIS — Z794 Long term (current) use of insulin: Secondary | ICD-10-CM | POA: Diagnosis not present

## 2018-07-17 DIAGNOSIS — H401134 Primary open-angle glaucoma, bilateral, indeterminate stage: Secondary | ICD-10-CM | POA: Diagnosis not present

## 2018-08-02 DIAGNOSIS — H401131 Primary open-angle glaucoma, bilateral, mild stage: Secondary | ICD-10-CM | POA: Diagnosis not present

## 2018-08-09 ENCOUNTER — Other Ambulatory Visit: Payer: Self-pay

## 2018-08-09 NOTE — Patient Outreach (Signed)
  [facility]) Care Management Chronic Special Needs Program  08/09/2018  Name: Aaron Blevins DOB: 1946/03/16  MRN: [id number]  Mr. Aaron Blevins is enrolled in a chronic special needs plan for Diabetes. Chronic Care Management Coordinator telephoned client to review health risk assessment and to develop individualized care plan.  Introduced the chronic care management program, importance of client participation, and taking their care plan to all provider appointments and inpatient facilities.  Reviewed the transition of care process and possible referral to community care management.  Subjective: Client reports history of diabetes, stroke and hypertension. Client reports primary care has changed to Dr. Larey Dresser. Client states he is on Lantus insulin and takes it at night. He reports he usually checks his blood sugar at night when he takes his night time insulin. He states he sometimes checks in the morning before he eats. An is not sure of his last A1C level. Mr. Debruhl states he recently changed primary care providers and has an appointment next month. Mr. Muscari denies any questions or concerns at this time. He is on greater than 8 medications and is receptive to [facility] pharmacy referral for medication review. He is also receptive to receiving and advanced care packet.  Goals Addressed            This Visit's Progress   . Client understands the importance of follow-up with providers by attending scheduled visits      . Client will use Assistive Devices as needed and verbalize understanding of device use      . Client will verbalize knowledge of self management of Hypertension as evidences by BP reading of 140/90 or less; or as defined by provider      . HEMOGLOBIN A1C < 7.0      . Maintain timely refills of diabetic medication as prescribed within the year .      Marland Kitchen Obtain annual  Lipid Profile, LDL-C      . Obtain Annual Eye (retinal)  Exam       . Obtain Annual Foot Exam       . Obtain annual screen for micro albuminuria (urine) , nephropathy (kidney problems)      . Obtain Hemoglobin A1C at least 2 times per year      . Visit Primary Care Provider or Endocrinologist at least 2 times per year          Plan:  Send successful outreach letter with a copy of their individualized care plan, Send individual care plan to provider and Send educational material Chronic care management coordination will outreach in: 6 Will refer client to: pharmacy  Kathyrn Sheriff, RN, MSN, [facility] Chronic Care Management Coordinator [facility] [phone]

## 2018-08-15 ENCOUNTER — Telehealth: Payer: Self-pay | Admitting: Pharmacist

## 2018-08-15 NOTE — Patient Outreach (Signed)
[facility]) Care Management  08/15/2018  Aaron Blevins 1946/03/12 [id number]   Called patient per referral for medication review as he is part of the CSNP program. Unfortunately, patient did not answer the phone. HIPAA compliant message was left on his voicemail.  Plan: Call patient back in 10-14 business days due to referral back log and nature of the referral.   Beecher Mcardle, PharmD, [facility] Clinical Pharmacist [phone]

## 2018-08-25 ENCOUNTER — Ambulatory Visit: Payer: Self-pay | Admitting: Pharmacist

## 2018-08-28 ENCOUNTER — Other Ambulatory Visit: Payer: Self-pay | Admitting: Pharmacist

## 2018-08-28 NOTE — Patient Outreach (Addendum)
[facility]) Care Management  [place]   08/28/2018  BRENNIN DURFEE 08/12/1945 [id number]  Reason for referral: Medication Assistance, Medication Review  Referral source: [place] Management RN with [employer] Current insurance: [employer]  PMHx includes but not limited to:  Type 2 diabetes, GERD, hypertension, hyperlipidemia.   Outreach:  Successful telephone call with patient.  HIPAA identifiers verified.   Subjective:  Patient reports using a pill box as an adherence strategy.  Does the patient ever forget to take medication?  yes Does the patient have problems obtaining medications due to transportation?   no Does the patient have problems obtaining medications due to cost?  no  Does the patient feel that medications prescribed are effective?  yes Does the patient ever experience any side effects to the medications prescribed?  no  Does the patient measure his/her own blood glucose at home?  Yes Does the patient measure his/her own blood pressure at home? Yes   Objective: Lab Results  Component Value Date   CREATININE 1.04 10/12/2017   CREATININE 1.20 07/29/2014   CREATININE 1.00 02/26/2014    Lab Results  Component Value Date   HGBA1C 5.9 (H) 10/19/2011    Lipid Panel     Component Value Date/Time   CHOL 144 10/19/2011 0725   TRIG 76 10/19/2011 0725   HDL 40 10/19/2011 0725   CHOLHDL 3.6 10/19/2011 0725   VLDL 15 10/19/2011 0725   LDLCALC 89 10/19/2011 0725    BP Readings from Last 3 Encounters:  10/12/17 (!) 184/95  08/10/14 161/89  07/29/14 158/89    No Known Allergies  Medications Reviewed Today    Reviewed by Elayne Guerin, [place] (Pharmacist) on 08/28/18 at 1403  Med List Status: <None>  Medication Order Taking? Sig Documenting Provider Last Dose Status Informant  aspirin 81 MG tablet [id number] Yes Take 81 mg by mouth daily. [provider] Taking Active   Blood Glucose  Monitoring Suppl (ONE TOUCH ULTRA 2) w/Device KIT [id number] Yes USE TWICE DAILY TO TEST BLOOD SUGAR [provider] Taking Active   fluticasone (FLONASE) 50 MCG/ACT nasal spray [id number] Yes Place 1 spray into both nostrils daily. [provider] Taking Active Self           Med Note Juleen China, Higinio Plan Aug 09, 2018 10:16 PM) Takes as needed.  insulin glargine (LANTUS) 100 UNIT/ML injection [id number] Yes Inject 15 Units into the skin at bedtime.  [provider] Taking Active Spouse/Significant Other  ketoconazole (NIZORAL) 2 % cream [id number] Yes Apply 1 application topically daily. [provider] Taking Active   latanoprost (XALATAN) 0.005 % ophthalmic solution [id number] Yes Place 1 drop into both eyes at bedtime. [provider] Taking Active Self  losartan-hydrochlorothiazide (HYZAAR) 50-12.5 MG tablet [id number] Yes Take 1 tablet by mouth daily. [provider] Taking Active   metFORMIN (GLUCOPHAGE-XR) 500 MG 24 hr tablet [id number] Yes Take 500 mg by mouth 2 (two) times daily with a meal. [provider] Taking Active Spouse/Significant Other           Med Note Corinna Lines Aug 28, 2018  1:55 PM)    Multiple Vitamins-Minerals (CENTRUM SILVER PO) [id number] Yes Take 1 tablet by mouth daily. [provider] Taking Active Self  nystatin cream (MYCOSTATIN) [id number] Yes APPLY CREAM TOPICALLY TO AFFECTED AREA TWICE DAILY (When needed) [provider] Taking Active   pravastatin (PRAVACHOL) 40  MG tablet [id number] Yes Take 40 mg by mouth daily. [provider] Taking Active Spouse/Significant Other  triamcinolone cream (KENALOG) 0.1 % [id number] Yes APPLY A THIN LAYER TOPICALLY TO AFFECTED AREA TWICE DAILY [provider] Taking Active           Assessment:  Drugs sorted by system:  Cardiovascular: Aspirin, Losartan-HCTZ, Pravastatin  Pulmonary/Allergy: Fluticasone Nasal  Spray  Gastrointestinal: Meclizine,   Endocrine: Lantus, Metformin   Topical: Ketoconazole, Triamcinolone  Vitamins/Minerals/Supplements: Multiple vitamin,   Miscellaneous: Latnanoprost  Medication Review Findings:  . [facility]. Patient said he is taking Metformin 5066m1 tablet twice daily.  [facility] before his provider's office changed.  Patient's HgA1c was 8% on 06/21/2018.  Scr 1.10  Medication Adherence Findings: Adherence Review  []  Excellent (no doses missed/week)     []  Good (no more than 1 dose missed/week)     [x]  Partial (2-3 doses missed/week) []  Poor (>3 doses missed/week)  Patient with fair understanding of regimen and fair understanding of indications.    Potential of compliance: fair  Medication Assistance Findings:  No medication assistance needs identified  Extra Help:  Requested status from HTA  Plan: . Will follow-up in 2 weeks to further investigate the metformin dose and to look into the patient's LIS status..  . Call Dr. GaFaythe Dingwallffice about metformin.  KaElayne GuerinPharmD, BCSouth Gate Ridgelinical Pharmacist ([phone]

## 2018-08-29 ENCOUNTER — Other Ambulatory Visit: Payer: Self-pay | Admitting: Pharmacist

## 2018-08-29 ENCOUNTER — Ambulatory Visit: Payer: Self-pay | Admitting: Pharmacist

## 2018-08-29 NOTE — Patient Outreach (Addendum)
[facility]) Care Management  08/29/2018  Aaron Blevins January 17, 1946 [id number]   Called Dr. Debe Coder office and left a message about the patient stating he is no longer taking metformin despite it being filled in February and still being on the [facility] medication list.    Plan: Await call back from provider's office. Follow up with patient in 14-21 days since his PCP has been notified.    Beecher Mcardle, PharmD, BCACP [facility] Clinical Pharmacist [phone]   Kim from Dr. Debe Coder office called back.  She said the patient is still supposed to be taking metformin. Patient was called back. Unfortunately, he did not answer the phone. HIPAA compliant message was left on his voicemail.    Follow up with the patient in 2-3 business days to let him know he is supposed to be taking metformin.   Beecher Mcardle, PharmD, BCACP [facility] Clinical Pharmacist [phone]

## 2018-08-31 ENCOUNTER — Other Ambulatory Visit: Payer: Self-pay | Admitting: Pharmacist

## 2018-08-31 NOTE — Patient Outreach (Signed)
[facility]) Care Management  08/31/2018  Aaron Blevins 12-04-1945 [id number]   Called patient to discuss metformin. Spoke with his wife Aaron Blevins). HIPAA identifiers were obtained. Aaron Blevins confirmed the patient is taking metformin 1000 mg 1 tablet twice daily. Medication list updated.  Plan: Follow up with patient quarterly.   Beecher Mcardle, PharmD, BCACP [facility] Clinical Pharmacist [phone]

## 2018-09-01 ENCOUNTER — Ambulatory Visit: Payer: Self-pay | Admitting: Pharmacist

## 2018-09-13 DIAGNOSIS — I10 Essential (primary) hypertension: Secondary | ICD-10-CM | POA: Diagnosis not present

## 2018-09-13 DIAGNOSIS — R350 Frequency of micturition: Secondary | ICD-10-CM | POA: Diagnosis not present

## 2018-09-13 DIAGNOSIS — E782 Mixed hyperlipidemia: Secondary | ICD-10-CM | POA: Diagnosis not present

## 2018-09-13 DIAGNOSIS — Z794 Long term (current) use of insulin: Secondary | ICD-10-CM | POA: Diagnosis not present

## 2018-09-13 DIAGNOSIS — E1165 Type 2 diabetes mellitus with hyperglycemia: Secondary | ICD-10-CM | POA: Diagnosis not present

## 2018-09-19 ENCOUNTER — Ambulatory Visit: Payer: Self-pay | Admitting: Pharmacist

## 2018-11-03 ENCOUNTER — Other Ambulatory Visit: Payer: Self-pay | Admitting: Pharmacist

## 2018-11-03 NOTE — Patient Outreach (Addendum)
[facility]) Care Management  11/03/2018  Aaron Blevins 1945-08-30 [id number]   Patient was called for quarterly CSNP follow up. HIPAA identifiers were obtained. Patient reported he is taking all of his medications as prescribed. During our last medication review call in April, there was come confusion about metformin. Patient confirmed he is taking metformin as prescribed.  He saw Dr. 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  HgA1c after visit in April was 6%.  Medications Reviewed Today    Reviewed by Elayne Guerin, [facility] (Pharmacist) on 11/03/18 at 1113  Med List Status: <None>  Medication Order Taking? Sig Documenting Provider Last Dose Status Informant  aspirin 81 MG tablet [id number] Yes Take 81 mg by mouth daily. [provider] Taking Active   Blood Glucose Monitoring Suppl (ONE TOUCH ULTRA 2) w/Device KIT [id number] Yes USE TWICE DAILY TO TEST BLOOD SUGAR [provider] Taking Active   famotidine (PEPCID) 40 MG tablet [id number] Yes Take 40 mg by mouth daily. [provider] Taking Active   fluticasone (FLONASE) 50 MCG/ACT nasal spray [id number] Yes Place 1 spray into both nostrils daily. [provider] Taking Active Self           Med Note Juleen China, [place] Plan Aug 09, 2018 10:16 PM) Takes as needed.  insulin glargine (LANTUS) 100 UNIT/ML injection [id number] Yes Inject 15 Units into the skin at bedtime.  [provider] Taking Active Spouse/Significant Other  ketoconazole (NIZORAL) 2 % cream [id number] Yes Apply 1 application topically daily. [provider] Taking Active   latanoprost (XALATAN) 0.005 % ophthalmic solution [id number] Yes Place 1 drop into both eyes at bedtime. [provider] Taking Active Self  losartan-hydrochlorothiazide (HYZAAR) 50-12.5 MG tablet [id number] Yes Take 1 tablet by mouth daily. [provider] Taking  Active   meclizine (ANTIVERT) 25 MG tablet [id number] Yes Take 25 mg by mouth 3 (three) times daily as needed for dizziness. [provider] Taking Active   metFORMIN (GLUCOPHAGE) 1000 MG tablet [id number] Yes Take 1,000 mg by mouth 2 (two) times daily with a meal. [provider] Taking Active   Multiple Vitamins-Minerals (CENTRUM SILVER PO) [id number] Yes Take 1 tablet by mouth daily. [provider] Taking Active Self  nystatin cream (MYCOSTATIN) [id number] Yes APPLY CREAM TOPICALLY TO AFFECTED AREA TWICE DAILY (When needed) [provider] Taking Active   pravastatin (PRAVACHOL) 20 MG tablet [id number] Yes Take 20 mg by mouth daily. [provider] Taking Active   silodosin (RAPAFLO) 8 MG CAPS capsule [id number] Yes TAKE 1 CAPSULE BY MOUTH ONCE DAILY WITH BREAKFAST [provider] Taking Active   triamcinolone cream (KENALOG) 0.1 % [id number] Yes APPLY A THIN LAYER TOPICALLY TO AFFECTED AREA TWICE DAILY [provider] Taking Active            Plan: Call patient back in 3-4 months for follow up.   Elayne Guerin, PharmD, [place] Clinical Pharmacist [phone]

## 2018-11-14 DIAGNOSIS — L219 Seborrheic dermatitis, unspecified: Secondary | ICD-10-CM | POA: Diagnosis not present

## 2018-11-14 DIAGNOSIS — B356 Tinea cruris: Secondary | ICD-10-CM | POA: Diagnosis not present

## 2018-12-21 DIAGNOSIS — N401 Enlarged prostate with lower urinary tract symptoms: Secondary | ICD-10-CM | POA: Diagnosis not present

## 2018-12-21 DIAGNOSIS — I10 Essential (primary) hypertension: Secondary | ICD-10-CM | POA: Diagnosis not present

## 2018-12-21 DIAGNOSIS — E1165 Type 2 diabetes mellitus with hyperglycemia: Secondary | ICD-10-CM | POA: Diagnosis not present

## 2018-12-21 DIAGNOSIS — E782 Mixed hyperlipidemia: Secondary | ICD-10-CM | POA: Diagnosis not present

## 2018-12-27 DIAGNOSIS — E782 Mixed hyperlipidemia: Secondary | ICD-10-CM | POA: Diagnosis not present

## 2018-12-27 DIAGNOSIS — E1165 Type 2 diabetes mellitus with hyperglycemia: Secondary | ICD-10-CM | POA: Diagnosis not present

## 2018-12-27 DIAGNOSIS — Z794 Long term (current) use of insulin: Secondary | ICD-10-CM | POA: Diagnosis not present

## 2018-12-27 DIAGNOSIS — I10 Essential (primary) hypertension: Secondary | ICD-10-CM | POA: Diagnosis not present

## 2019-01-24 ENCOUNTER — Other Ambulatory Visit: Payer: Self-pay

## 2019-01-24 NOTE — Patient Outreach (Signed)
  [facility]) Care Management Chronic Special Needs Program  01/24/2019  Name: Aaron Blevins DOB: [birth year]  MRN: [id number]  [name] is enrolled in a chronic special needs plan for Diabetes. Reviewed and updated care plan.  Subjective: client reports he has been to his provider and that his blood sugars are doing well. He reports A1C was good, but was not able to state the exact number. Per chart A1C  Is 6. Client reports he is scheduled for his eye exam next week. He states he has all his medications and has no questions regarding medications. Client is unsure if he received an advanced directive packet and request RNCM resend the packet. Client reports some pain the the left shoulder which is new. He states it is about a "7" on the pain scale and states it is worsened with movement. He states he has tried heat and cold. Client reports he is going to call his primary care regarding his shoulder today.  Goals Addressed            This Visit's Progress   . COMPLETED: Client understands the importance of follow-up with providers by attending scheduled visits       Attends scheduled office visits.    . COMPLETED: Client will use Assistive Devices as needed and verbalize understanding of device use       Denies any questions on use of glucometer    . COMPLETED: Client will verbalize knowledge of self management of Hypertension as evidences by BP reading of 140/90 or less; or as defined by provider       Takes medications as scheduled, attends follow up appointments, monitors salt intake. Client reports last BP 108/70.    Marland Kitchen COMPLETED: HEMOGLOBIN A1C < 7       A1C 6.0 per chart    . COMPLETED: Maintain timely refills of diabetic medication as prescribed within the year .       Reports no difficulty obtaining medications    . COMPLETED: Obtain annual  Lipid Profile, LDL-C       Done 09/13/2018    . Obtain Annual Eye (retinal)  Exam    On track    Per client,  scheduled next week.    . COMPLETED: Obtain Annual Foot Exam       Per client done 09/13/2018    . COMPLETED: Obtain annual screen for micro albuminuria (urine) , nephropathy (kidney problems)       Done 06/21/2018    . COMPLETED: Obtain Hemoglobin A1C at least 2 times per year       Done 06/21/2018 and 09/13/2018    . COMPLETED: Visit Primary Care Provider or Endocrinologist at least 2 times per year        06/21/2018 and 09/13/2018      RNCM reinforced covid-19 precautions; encouraged client to call RNCM as needed; RNCM reinforced 24 hour nurse advice line availability; RNCM encouraged client to call concierge for benefits questions.  Plan: RNCM will send updated care plan to client; [facility] will send care plan to primary care. RNCM will send advanced directive packet. RNCM will outreach in 6 months.    Thea Silversmith, RN, MSN, [place] [phone]   .

## 2019-01-30 DIAGNOSIS — H401131 Primary open-angle glaucoma, bilateral, mild stage: Secondary | ICD-10-CM | POA: Diagnosis not present

## 2019-02-12 ENCOUNTER — Other Ambulatory Visit: Payer: Self-pay | Admitting: Pharmacist

## 2019-02-12 NOTE — Patient Outreach (Signed)
[facility]) Care Management  02/12/2019  Aaron Blevins [birth year] [id number]   Patient was called for quarterly CSNP follow up. His wife, [name] answered the phone and provided HIPAA.  She said the patient was asleep at the time of my call but that he has been feeling "ok" and did not have any medication concerns.  Medications were reviewed via telelphone: Medications Reviewed Today    Reviewed by Elayne Guerin, [facility] (Pharmacist) on 02/12/19 at 0910  Med List Status: <None>  Medication Order Taking? Sig Documenting Provider Last Dose Status Informant  aspirin 81 MG tablet [id number] Yes Take 81 mg by mouth daily. [provider] Taking Active   Blood Glucose Monitoring Suppl (ONE TOUCH ULTRA 2) w/Device KIT [id number] Yes USE TWICE DAILY TO TEST BLOOD SUGAR [provider] Taking Active   famotidine (PEPCID) 40 MG tablet [id number] Yes Take 40 mg by mouth daily. [provider] Taking Active   fluticasone (FLONASE) 50 MCG/ACT nasal spray [id number] Yes Place 1 spray into both nostrils daily. [provider] Taking Active Self           Med Note Juleen China, [place] Plan Aug 09, 2018 10:16 PM) Takes as needed.  insulin glargine (LANTUS) 100 UNIT/ML injection [id number] Yes Inject 20 Units into the skin at bedtime.  [provider] Taking Active Spouse/Significant Other  ketoconazole (NIZORAL) 2 % cream [id number] Yes Apply 1 application topically daily. [provider] Taking Active   latanoprost (XALATAN) 0.005 % ophthalmic solution [id number] Yes Place 1 drop into both eyes at bedtime. [provider] Taking Active Self  losartan (COZAAR) 25 MG tablet [id number] Yes Take 1 tablet by mouth daily. [provider] Taking Active   meclizine (ANTIVERT) 25 MG tablet [id number] Yes Take 25 mg by mouth 3 (three) times daily as needed for dizziness. [provider] Taking Active   metFORMIN (GLUCOPHAGE) 1000 MG  tablet [id number] Yes Take 1,000 mg by mouth 2 (two) times daily with a meal. [provider] Taking Active   Multiple Vitamins-Minerals (CENTRUM SILVER PO) [id number] Yes Take 1 tablet by mouth daily. [provider] Taking Active Self  nystatin cream (MYCOSTATIN) [id number] Yes APPLY CREAM TOPICALLY TO AFFECTED AREA TWICE DAILY (When needed) [provider] Taking Active   pravastatin (PRAVACHOL) 20 MG tablet [id number] Yes Take 20 mg by mouth daily. [provider] Taking Active   silodosin (RAPAFLO) 8 MG CAPS capsule [id number] Yes TAKE 1 CAPSULE BY MOUTH ONCE DAILY WITH BREAKFAST [provider] Taking Active   triamcinolone cream (KENALOG) 0.1 % [id number] Yes APPLY A THIN LAYER TOPICALLY TO AFFECTED AREA TWICE DAILY [provider] Taking Active            Findings: Patient's HgA1c has increased to 7.6% (08/20) up from 6.  His Lantus dose was increased from 15 units daily to 20 units daily in response.  On statin (Pravastatin) last filled 09/13/2018. Patient's wife said she would call in a refill today.  Plan: Follow up on blood sugars and refill in 7-10 business days.  Elayne Guerin, PharmD, [place] Clinical Pharmacist [phone]

## 2019-02-14 DIAGNOSIS — M19012 Primary osteoarthritis, left shoulder: Secondary | ICD-10-CM | POA: Diagnosis not present

## 2019-02-14 DIAGNOSIS — M25512 Pain in left shoulder: Secondary | ICD-10-CM | POA: Diagnosis not present

## 2019-02-22 DIAGNOSIS — M19012 Primary osteoarthritis, left shoulder: Secondary | ICD-10-CM | POA: Diagnosis not present

## 2019-02-22 DIAGNOSIS — M25512 Pain in left shoulder: Secondary | ICD-10-CM | POA: Diagnosis not present

## 2019-02-23 ENCOUNTER — Other Ambulatory Visit: Payer: Self-pay | Admitting: Pharmacist

## 2019-02-23 NOTE — Patient Outreach (Signed)
[facility]) Care Management  02/23/2019  Aaron Blevins [birth year] [id number]   Patient was called to follow up on statin adherence. Patient's wife [name]) answered the phone. She said the patient was not home. She reported he picked up pravastatin from the pharmacy last week.  Called [facility], the Pharmacist verified Pravastatin 20 mg was picked up on 02/17/2019 for a 90 day supply.  Plan: Follow up with patient in 6-8 weeks for CSNP follow up.  Elayne Guerin, PharmD, [place] Clinical Pharmacist [phone]

## 2019-03-08 DIAGNOSIS — Z20828 Contact with and (suspected) exposure to other viral communicable diseases: Secondary | ICD-10-CM | POA: Diagnosis not present

## 2019-03-28 DIAGNOSIS — N401 Enlarged prostate with lower urinary tract symptoms: Secondary | ICD-10-CM | POA: Diagnosis not present

## 2019-03-28 DIAGNOSIS — I10 Essential (primary) hypertension: Secondary | ICD-10-CM | POA: Diagnosis not present

## 2019-03-28 DIAGNOSIS — E1165 Type 2 diabetes mellitus with hyperglycemia: Secondary | ICD-10-CM | POA: Diagnosis not present

## 2019-03-28 DIAGNOSIS — E782 Mixed hyperlipidemia: Secondary | ICD-10-CM | POA: Diagnosis not present

## 2019-03-31 DIAGNOSIS — M7918 Myalgia, other site: Secondary | ICD-10-CM | POA: Diagnosis not present

## 2019-03-31 DIAGNOSIS — J849 Interstitial pulmonary disease, unspecified: Secondary | ICD-10-CM | POA: Diagnosis not present

## 2019-03-31 DIAGNOSIS — R072 Precordial pain: Secondary | ICD-10-CM | POA: Diagnosis not present

## 2019-03-31 DIAGNOSIS — M19012 Primary osteoarthritis, left shoulder: Secondary | ICD-10-CM | POA: Diagnosis not present

## 2019-03-31 DIAGNOSIS — R079 Chest pain, unspecified: Secondary | ICD-10-CM | POA: Diagnosis not present

## 2019-03-31 DIAGNOSIS — I491 Atrial premature depolarization: Secondary | ICD-10-CM | POA: Diagnosis not present

## 2019-03-31 DIAGNOSIS — M47814 Spondylosis without myelopathy or radiculopathy, thoracic region: Secondary | ICD-10-CM | POA: Diagnosis not present

## 2019-03-31 DIAGNOSIS — U071 COVID-19: Secondary | ICD-10-CM | POA: Diagnosis not present

## 2019-03-31 DIAGNOSIS — Z8249 Family history of ischemic heart disease and other diseases of the circulatory system: Secondary | ICD-10-CM | POA: Diagnosis not present

## 2019-03-31 DIAGNOSIS — I493 Ventricular premature depolarization: Secondary | ICD-10-CM | POA: Diagnosis not present

## 2019-03-31 DIAGNOSIS — E119 Type 2 diabetes mellitus without complications: Secondary | ICD-10-CM | POA: Diagnosis not present

## 2019-03-31 DIAGNOSIS — R05 Cough: Secondary | ICD-10-CM | POA: Diagnosis not present

## 2019-03-31 DIAGNOSIS — M19011 Primary osteoarthritis, right shoulder: Secondary | ICD-10-CM | POA: Diagnosis not present

## 2019-03-31 DIAGNOSIS — I10 Essential (primary) hypertension: Secondary | ICD-10-CM | POA: Diagnosis not present

## 2019-03-31 DIAGNOSIS — E785 Hyperlipidemia, unspecified: Secondary | ICD-10-CM | POA: Diagnosis not present

## 2019-03-31 DIAGNOSIS — R9431 Abnormal electrocardiogram [ECG] [EKG]: Secondary | ICD-10-CM | POA: Diagnosis not present

## 2019-03-31 DIAGNOSIS — Z20828 Contact with and (suspected) exposure to other viral communicable diseases: Secondary | ICD-10-CM | POA: Diagnosis not present

## 2019-03-31 DIAGNOSIS — Z794 Long term (current) use of insulin: Secondary | ICD-10-CM | POA: Diagnosis not present

## 2019-04-01 DIAGNOSIS — I493 Ventricular premature depolarization: Secondary | ICD-10-CM | POA: Diagnosis not present

## 2019-04-23 ENCOUNTER — Ambulatory Visit: Payer: Self-pay | Admitting: Pharmacist

## 2019-04-23 ENCOUNTER — Other Ambulatory Visit: Payer: Self-pay | Admitting: Pharmacist

## 2019-04-23 NOTE — Patient Outreach (Signed)
[name]) Care Management  04/23/2019  Aaron Blevins [birth year] [id number]   Patient was called regarding CSNP follow up. Unfortunately, he did not answer the phone. HIPAA compliant message was left on his voicemail.  Plan: Call patient back in 3 months.  Elayne Guerin, PharmD, [place] Clinical Pharmacist [phone]

## 2019-04-24 DIAGNOSIS — I10 Essential (primary) hypertension: Secondary | ICD-10-CM | POA: Diagnosis not present

## 2019-04-24 DIAGNOSIS — E782 Mixed hyperlipidemia: Secondary | ICD-10-CM | POA: Diagnosis not present

## 2019-04-24 DIAGNOSIS — E1165 Type 2 diabetes mellitus with hyperglycemia: Secondary | ICD-10-CM | POA: Diagnosis not present

## 2019-04-24 DIAGNOSIS — Z794 Long term (current) use of insulin: Secondary | ICD-10-CM | POA: Diagnosis not present

## 2019-04-24 DIAGNOSIS — Z125 Encounter for screening for malignant neoplasm of prostate: Secondary | ICD-10-CM | POA: Diagnosis not present

## 2019-05-17 DIAGNOSIS — B356 Tinea cruris: Secondary | ICD-10-CM | POA: Diagnosis not present

## 2019-05-17 DIAGNOSIS — R350 Frequency of micturition: Secondary | ICD-10-CM | POA: Diagnosis not present

## 2019-05-17 DIAGNOSIS — R809 Proteinuria, unspecified: Secondary | ICD-10-CM | POA: Diagnosis not present

## 2019-06-28 DIAGNOSIS — E119 Type 2 diabetes mellitus without complications: Secondary | ICD-10-CM | POA: Diagnosis not present

## 2019-06-28 DIAGNOSIS — I10 Essential (primary) hypertension: Secondary | ICD-10-CM | POA: Diagnosis not present

## 2019-06-28 DIAGNOSIS — Z23 Encounter for immunization: Secondary | ICD-10-CM | POA: Diagnosis not present

## 2019-06-28 DIAGNOSIS — E782 Mixed hyperlipidemia: Secondary | ICD-10-CM | POA: Diagnosis not present

## 2019-06-28 DIAGNOSIS — Z794 Long term (current) use of insulin: Secondary | ICD-10-CM | POA: Diagnosis not present

## 2019-06-28 DIAGNOSIS — E1165 Type 2 diabetes mellitus with hyperglycemia: Secondary | ICD-10-CM | POA: Diagnosis not present

## 2019-06-28 DIAGNOSIS — N401 Enlarged prostate with lower urinary tract symptoms: Secondary | ICD-10-CM | POA: Diagnosis not present

## 2019-07-16 ENCOUNTER — Ambulatory Visit: Payer: Self-pay | Admitting: Pharmacist

## 2019-07-17 ENCOUNTER — Other Ambulatory Visit: Payer: Self-pay

## 2019-07-17 NOTE — Patient Outreach (Signed)
  [facility]) Care Management Chronic Special Needs Program    07/17/2019  Name: Aaron Blevins, DOB: 15-Jan-1946  MRN: [id number]   Mr. Aaron Blevins is enrolled in a chronic special needs plan for Diabetes. RNCM called to assist with completing health risk assessment and to updated individualized care plan. No answer. HIPAA compliant message left.   Plan: Chronic care management coordinator will attempt outreach within 1-2 weeks.  Kathyrn Sheriff, RN, MSN, [facility] Chronic Care Management Coordinator [facility] [phone]

## 2019-07-18 ENCOUNTER — Other Ambulatory Visit: Payer: Self-pay

## 2019-07-18 NOTE — Patient Outreach (Signed)
  [facility]) Care Management Chronic Special Needs Program    07/18/2019  Name: NICHOLIS STEPANEK, DOB: 1946/04/07  MRN: [id number]   Mr. Audon Heymann is enrolled in a chronic special needs plan for Diabetes. RNCM called to assist client with completing hearlth risk assessment and to update individualized care plan. No answer. HIPAA compliant message left.   Plan: Chronic care management coordinator will attempt outreach within 2-3 weeks.   Kathyrn Sheriff, RN, MSN, [facility] Chronic Care Management Coordinator [facility] [phone]

## 2019-07-23 ENCOUNTER — Other Ambulatory Visit: Payer: Self-pay

## 2019-07-23 NOTE — Patient Outreach (Signed)
[facility]) Care Management Chronic Special Needs Program  07/23/2019  Name: Aaron Blevins DOB: 1945-06-10  MRN: [id number]  Mr. Aaron Blevins is enrolled in a chronic special needs plan for Diabetes A completed health risk assessment has not been received from the client. RNCM has attempted to outreach to client x3 unsuccessfully. HIPAA compliant message left.   The client's individualized care plan was developed based on available data.   Goals Addressed            This Visit's Progress   . Client will not report change from baseline and no repeated symptoms of stroke with in the next 6 months   On track    It is important to follow up with your provider as scheduled. Please take your medications as scheduled. Stroke Symptoms-Spot a stroke F.A.S.T. FACE DROOPING Does one side of the face droop or is it numb? Ask the person to smile. ARM WEAKNESS Is one arm weak or numb? Ask the person to raise both arms. Does one arm drift downward? SPEECH DIFFICULTY Is speech slurred, are they unable to speak, or are they hard to understand? Ask the person to repeat a simple sentence, like "the sky is blue." Is the sentence repeated correctly? TIME TO CALL 9-1-1 If the person shows any of these symptoms, even if the symptoms go away, call 9-1-1 and get them to the hospital immediately. Mailed education:   Mailed education," lowering the risk of having another stroke". Please review and call if you have any questions.     . Client will verbalize knowledge of self management of Hypertension as evidences by BP reading of 140/90 or less; or as defined by provider   On track    Follow up with your doctor as scheduled. Take your medications as prescribed by your doctor. Ask your doctor "what is my target blood pressure range". Monitor your blood pressure and take results to your doctor's appointment.  Monitor the amount of salt you are eating. Continue to exercise as tolerated  and remain active. Mailed education: "about high blood pressure(Hypertension)". Please review and call if you have any questions.     Marland Kitchen HEMOGLOBIN A1C < 7       A1C 12/27/2018 7.6%; A1C 6.0 09/13/2018  Diabetes self management actions:  Glucose monitoring per provider recommendations  Eat Healthy  Check feet daily  Visit provider every 3-6 months as directed  Hbg A1C level every 3-6 months.  Eye Exam yearly  Ask your doctor, "what is my Target A1C goal?"  Ask your doctor, "what is my Target Blood sugar range?"    . Obtain annual  Lipid Profile, LDL-C   On track    It is important for you to follow up with your provider as scheduled for recommended labs.    Clydene Pugh Annual Eye (retinal)  Exam    On track    Eye exam completed 01/30/2019 per Dr. Severiano Gilbert  Goal renewed 2021. It is important to follow up with your provider as scheduled for recommend exam/procedures.    . Obtain Annual Foot Exam   On track    It is important for you to follow up with your provider as scheduled for recommended exam.    . Obtain annual screen for micro albuminuria (urine) , nephropathy (kidney problems)   On track    It is important for you to follow up with your provider as scheduled for recommended labs/procedures. This is a test that looks at how  your kidneys are working.    . Obtain Hemoglobin A1C at least 2 times per year   On track    It is important for you to follow up with your provider as scheduled for recommended labs.    . Visit Primary Care Provider or Endocrinologist at least 2 times per year    On track    It is important for you to follow up with your provider as scheduled for exam and recommended labs/procedures. If you have not had your annual wellness exam, please call your provider to schedule.      Plan:  . Send unsuccessful outreach letter with a copy of individualized care plan to client . Send individualized care plan to provider . Send Medical sales representative.  Chronic care management coordinator will attempt outreach per Tier level in 6 months.    Thea Silversmith, RN, MSN, [place] [phone]

## 2019-08-22 ENCOUNTER — Other Ambulatory Visit: Payer: Self-pay | Admitting: Pharmacist

## 2019-08-22 NOTE — Patient Outreach (Signed)
[facility] ([facility])  [facility] Quality Pharmacy Team    [facility] pharmacy case will be closed as our team is transitioning from the [facility] Care Management Department into the [facility] Quality Department and will no longer be using [facility] for documentation purposes.      Keith Cancio J. Antonios Ostrow, PharmD, BCACP [facility] Clinical Pharmacist [phone]   

## 2019-08-24 ENCOUNTER — Ambulatory Visit: Payer: Self-pay | Admitting: Pharmacist

## 2019-09-26 DIAGNOSIS — E782 Mixed hyperlipidemia: Secondary | ICD-10-CM | POA: Diagnosis not present

## 2019-09-26 DIAGNOSIS — Z794 Long term (current) use of insulin: Secondary | ICD-10-CM | POA: Diagnosis not present

## 2019-09-26 DIAGNOSIS — I10 Essential (primary) hypertension: Secondary | ICD-10-CM | POA: Diagnosis not present

## 2019-09-26 DIAGNOSIS — E1165 Type 2 diabetes mellitus with hyperglycemia: Secondary | ICD-10-CM | POA: Diagnosis not present

## 2019-10-03 DIAGNOSIS — N401 Enlarged prostate with lower urinary tract symptoms: Secondary | ICD-10-CM | POA: Diagnosis not present

## 2019-10-03 DIAGNOSIS — E782 Mixed hyperlipidemia: Secondary | ICD-10-CM | POA: Diagnosis not present

## 2019-10-03 DIAGNOSIS — E1165 Type 2 diabetes mellitus with hyperglycemia: Secondary | ICD-10-CM | POA: Diagnosis not present

## 2019-10-03 DIAGNOSIS — Z0000 Encounter for general adult medical examination without abnormal findings: Secondary | ICD-10-CM | POA: Diagnosis not present

## 2019-10-03 DIAGNOSIS — I10 Essential (primary) hypertension: Secondary | ICD-10-CM | POA: Diagnosis not present

## 2019-10-18 DIAGNOSIS — Z1212 Encounter for screening for malignant neoplasm of rectum: Secondary | ICD-10-CM | POA: Diagnosis not present

## 2019-10-18 DIAGNOSIS — Z1211 Encounter for screening for malignant neoplasm of colon: Secondary | ICD-10-CM | POA: Diagnosis not present

## 2019-12-12 DIAGNOSIS — E119 Type 2 diabetes mellitus without complications: Secondary | ICD-10-CM | POA: Diagnosis not present

## 2019-12-27 DIAGNOSIS — I10 Essential (primary) hypertension: Secondary | ICD-10-CM | POA: Diagnosis not present

## 2019-12-27 DIAGNOSIS — D649 Anemia, unspecified: Secondary | ICD-10-CM | POA: Diagnosis not present

## 2019-12-27 DIAGNOSIS — E782 Mixed hyperlipidemia: Secondary | ICD-10-CM | POA: Diagnosis not present

## 2019-12-27 DIAGNOSIS — Z794 Long term (current) use of insulin: Secondary | ICD-10-CM | POA: Diagnosis not present

## 2019-12-27 DIAGNOSIS — E1165 Type 2 diabetes mellitus with hyperglycemia: Secondary | ICD-10-CM | POA: Diagnosis not present

## 2020-01-03 DIAGNOSIS — R1312 Dysphagia, oropharyngeal phase: Secondary | ICD-10-CM | POA: Diagnosis not present

## 2020-01-03 DIAGNOSIS — N401 Enlarged prostate with lower urinary tract symptoms: Secondary | ICD-10-CM | POA: Diagnosis not present

## 2020-01-03 DIAGNOSIS — I10 Essential (primary) hypertension: Secondary | ICD-10-CM | POA: Diagnosis not present

## 2020-01-03 DIAGNOSIS — E1165 Type 2 diabetes mellitus with hyperglycemia: Secondary | ICD-10-CM | POA: Diagnosis not present

## 2020-01-08 DIAGNOSIS — H40013 Open angle with borderline findings, low risk, bilateral: Secondary | ICD-10-CM | POA: Diagnosis not present

## 2020-01-08 DIAGNOSIS — E119 Type 2 diabetes mellitus without complications: Secondary | ICD-10-CM | POA: Diagnosis not present

## 2020-01-12 ENCOUNTER — Emergency Department (HOSPITAL_BASED_OUTPATIENT_CLINIC_OR_DEPARTMENT_OTHER)
Admission: EM | Admit: 2020-01-12 | Discharge: 2020-01-12 | Disposition: A | Discharge status: Home / Self Care | Payer: HMO | Attending: Emergency Medicine | Admitting: Emergency Medicine

## 2020-01-12 ENCOUNTER — Other Ambulatory Visit: Payer: Self-pay

## 2020-01-12 ENCOUNTER — Emergency Department (HOSPITAL_BASED_OUTPATIENT_CLINIC_OR_DEPARTMENT_OTHER): Payer: HMO

## 2020-01-12 ENCOUNTER — Encounter (HOSPITAL_BASED_OUTPATIENT_CLINIC_OR_DEPARTMENT_OTHER): Payer: Self-pay | Admitting: Emergency Medicine

## 2020-01-12 DIAGNOSIS — I10 Essential (primary) hypertension: Secondary | ICD-10-CM | POA: Insufficient documentation

## 2020-01-12 DIAGNOSIS — G9389 Other specified disorders of brain: Secondary | ICD-10-CM | POA: Diagnosis not present

## 2020-01-12 DIAGNOSIS — R9082 White matter disease, unspecified: Secondary | ICD-10-CM | POA: Diagnosis not present

## 2020-01-12 DIAGNOSIS — Z7984 Long term (current) use of oral hypoglycemic drugs: Secondary | ICD-10-CM | POA: Diagnosis not present

## 2020-01-12 DIAGNOSIS — R42 Dizziness and giddiness: Secondary | ICD-10-CM

## 2020-01-12 DIAGNOSIS — E119 Type 2 diabetes mellitus without complications: Secondary | ICD-10-CM | POA: Diagnosis not present

## 2020-01-12 DIAGNOSIS — Z87891 Personal history of nicotine dependence: Secondary | ICD-10-CM | POA: Diagnosis not present

## 2020-01-12 DIAGNOSIS — Z7982 Long term (current) use of aspirin: Secondary | ICD-10-CM | POA: Diagnosis not present

## 2020-01-12 DIAGNOSIS — J3489 Other specified disorders of nose and nasal sinuses: Secondary | ICD-10-CM | POA: Diagnosis not present

## 2020-01-12 DIAGNOSIS — I6782 Cerebral ischemia: Secondary | ICD-10-CM | POA: Diagnosis not present

## 2020-01-12 DIAGNOSIS — Z79899 Other long term (current) drug therapy: Secondary | ICD-10-CM | POA: Diagnosis not present

## 2020-01-12 LAB — CBC WITH DIFFERENTIAL/PLATELET
Abs Immature Granulocytes: 0.02 10*3/uL (ref 0.00–0.07)
Basophils Absolute: 0 10*3/uL (ref 0.0–0.1)
Basophils Relative: 1 %
Eosinophils Absolute: 0.2 10*3/uL (ref 0.0–0.5)
Eosinophils Relative: 3 %
HCT: 37.6 % — ABNORMAL LOW (ref 39.0–52.0)
Hemoglobin: 12.5 g/dL — ABNORMAL LOW (ref 13.0–17.0)
Immature Granulocytes: 0 %
Lymphocytes Relative: 24 %
Lymphs Abs: 1.7 10*3/uL (ref 0.7–4.0)
MCH: 29.8 pg (ref 26.0–34.0)
MCHC: 33.2 g/dL (ref 30.0–36.0)
MCV: 89.5 fL (ref 80.0–100.0)
Monocytes Absolute: 0.7 10*3/uL (ref 0.1–1.0)
Monocytes Relative: 10 %
Neutro Abs: 4.4 10*3/uL (ref 1.7–7.7)
Neutrophils Relative %: 62 %
Platelets: 155 10*3/uL (ref 150–400)
RBC: 4.2 MIL/uL — ABNORMAL LOW (ref 4.22–5.81)
RDW: 13 % (ref 11.5–15.5)
WBC: 7.1 10*3/uL (ref 4.0–10.5)
nRBC: 0 % (ref 0.0–0.2)

## 2020-01-12 LAB — BASIC METABOLIC PANEL
Anion gap: 9 (ref 5–15)
BUN: 14 mg/dL (ref 8–23)
CO2: 27 mmol/L (ref 22–32)
Calcium: 9.2 mg/dL (ref 8.9–10.3)
Chloride: 102 mmol/L (ref 98–111)
Creatinine, Ser: 1.03 mg/dL (ref 0.61–1.24)
GFR calc Af Amer: >60 mL/min (ref >60)
GFR calc non Af Amer: >60 mL/min (ref >60)
Glucose, Bld: 108 mg/dL — ABNORMAL HIGH (ref 70–99)
Potassium: 4.4 mmol/L (ref 3.5–5.1)
Sodium: 138 mmol/L (ref 135–145)

## 2020-01-12 LAB — TROPONIN I (HIGH SENSITIVITY): Troponin I (High Sensitivity): 6 ng/L (ref <18)

## 2020-01-12 MED ORDER — PROCHLORPERAZINE EDISYLATE 10 MG/2ML IJ SOLN
10.0000 mg | Freq: Once | INTRAMUSCULAR | Status: AC
  Administered 2020-01-12: 10 mg via INTRAVENOUS
  Filled 2020-01-12: qty 2

## 2020-01-12 MED ORDER — SODIUM CHLORIDE 0.9 % IV BOLUS
1000.0000 mL | Freq: Once | INTRAVENOUS | Status: AC
  Administered 2020-01-12: 1000 mL via INTRAVENOUS

## 2020-01-12 MED ORDER — DIPHENHYDRAMINE HCL 50 MG/ML IJ SOLN
25.0000 mg | Freq: Once | INTRAMUSCULAR | Status: AC
  Administered 2020-01-12: 25 mg via INTRAVENOUS
  Filled 2020-01-12: qty 1

## 2020-01-12 NOTE — ED Notes (Signed)
Pt discharged to home. Discharge instructions have been discussed with patient and/or family members. Pt verbally acknowledges understanding d/c instructions, and endorses comprehension to checkout at registration before leaving.  °

## 2020-01-12 NOTE — ED Notes (Signed)
Patient transported to MRI 

## 2020-01-12 NOTE — ED Triage Notes (Signed)
PT here with dizziness x 2 weeks. Saw PCP and he was told to take his meclizine. Hx of vertigo. Meds not helping.

## 2020-01-12 NOTE — ED Notes (Signed)
Spoke with Selena Batten in radiology, MRI will be done, several pts in front of this pt. EDP updated.

## 2020-01-12 NOTE — ED Notes (Signed)
EDP notified of bradicardia

## 2020-01-12 NOTE — ED Provider Notes (Signed)
[facility] EMERGENCY DEPARTMENT Provider Note   CSN: [id number] Arrival date & time: 01/12/20  6222     History Chief Complaint  Patient presents with  . Dizziness    Aaron Blevins is a 74 y.o. male.  Presents to ER with concern for dizziness.  Symptoms started approximately 2 weeks ago, reports that he has a history of vertigo and normally takes meclizine which helps with the symptoms however meclizine has not been helping.  Went to his primary care doctor who recommended continuing meclizine.  Reports that he has had some difficulty with walking due to the dizziness, described as unsteadiness, lightheadedness, no room spinning sensation.  No associated numbness, weakness, vision changes or speech changes.  Past medical history of "mild" stroke many years ago, diabetes, GERD, hypertension, hyperlipidemia.  HPI     Past Medical History:  Diagnosis Date  . Arthritis    RIGHT KNEE  . Diabetes mellitus   . GERD (gastroesophageal reflux disease)   . High cholesterol   . Hypertension   . Stroke [facility])    JUNE 2013--HAD TERRIBLE H/A--NO DEFICITS  . Trouble swallowing     Patient Active Problem List   Diagnosis Date Noted  . Hyperlipidemia 09/08/2017  . 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 Stroke ([place]) 10/19/2011  . DM (diabetes mellitus) ([place]) 10/19/2011  . HTN (hypertension) 10/19/2011  . GERD (gastroesophageal reflux disease) 10/19/2011    Past Surgical History:  Procedure Laterality Date  . BALLOON DILATION  04/12/2012   Procedure: BALLOON DILATION;  Surgeon: Arta Silence, MD;  Location: [facility] ENDOSCOPY;  Service: Endoscopy;  Laterality: N/A;  . CIRCUMCISION    . ESOPHAGOGASTRODUODENOSCOPY (EGD) WITH PROPOFOL  04/12/2012   Procedure: ESOPHAGOGASTRODUODENOSCOPY (EGD) WITH PROPOFOL;  Surgeon: Arta Silence, MD;  Location: [facility] ENDOSCOPY;  Service: Endoscopy;  Laterality: N/A;       History reviewed. No pertinent family history.  Social History   Tobacco Use   . Smoking status: Former Research scientist (life sciences)  . Smokeless tobacco: Never Used  Substance Use Topics  . Alcohol use: No  . Drug use: No    Home Medications Prior to Admission medications   Medication Sig Start Date End Date Taking? Authorizing Provider  aspirin 81 MG tablet Take 81 mg by mouth daily.    [provider]  Blood Glucose Monitoring Suppl (ONE TOUCH ULTRA 2) w/Device KIT USE TWICE DAILY TO TEST BLOOD SUGAR 07/13/18   [provider]  famotidine (PEPCID) 40 MG tablet Take 40 mg by mouth daily. 09/13/18   [provider]  fluticasone (FLONASE) 50 MCG/ACT nasal spray Place 1 spray into both nostrils daily.    [provider]  insulin glargine (LANTUS) 100 UNIT/ML injection Inject 20 Units into the skin at bedtime.     [provider]  ketoconazole (NIZORAL) 2 % cream Apply 1 application topically daily.    [provider]  latanoprost (XALATAN) 0.005 % ophthalmic solution Place 1 drop into both eyes at bedtime.    [provider]  losartan (COZAAR) 25 MG tablet Take 1 tablet by mouth daily. 12/14/18   [provider]  meclizine (ANTIVERT) 25 MG tablet Take 25 mg by mouth 3 (three) times daily as needed for dizziness.    [provider]  metFORMIN (GLUCOPHAGE) 1000 MG tablet Take 1,000 mg by mouth 2 (two) times daily with a meal. 06/23/18   [provider]  Multiple Vitamins-Minerals (CENTRUM SILVER PO) Take 1 tablet by mouth daily.    [provider]  nystatin cream (MYCOSTATIN) APPLY CREAM TOPICALLY TO AFFECTED AREA TWICE DAILY (When needed) 03/20/18   [provider]  pravastatin (PRAVACHOL) 20 MG tablet Take 20 mg by mouth daily. 09/13/18   [provider]  silodosin (RAPAFLO) 8 MG CAPS capsule TAKE 1 CAPSULE BY MOUTH ONCE DAILY WITH BREAKFAST 10/10/18   [provider]  triamcinolone cream (KENALOG) 0.1 % APPLY A THIN LAYER TOPICALLY TO AFFECTED AREA TWICE DAILY 03/20/18    [provider]    Allergies    Patient has no known allergies.  Review of Systems   Review of Systems  Constitutional: Negative for chills and fever.  HENT: Negative for ear pain and sore throat.   Eyes: Negative for pain and visual disturbance.  Respiratory: Negative for cough and shortness of breath.   Cardiovascular: Negative for chest pain and palpitations.  Gastrointestinal: Negative for abdominal pain and vomiting.  Genitourinary: Negative for dysuria and hematuria.  Musculoskeletal: Negative for arthralgias and back pain.  Skin: Negative for color change and rash.  Neurological: Positive for dizziness and light-headedness. Negative for seizures and syncope.  All other systems reviewed and are negative.   Physical Exam Updated Vital Signs BP (!) 175/102 (BP Location: Right Arm)   Pulse (!) 50   Temp 98.5 F (36.9 C) (Oral)   Resp 16   SpO2 99%   Physical Exam Vitals and nursing note reviewed.  Constitutional:      Appearance: He is well-developed.  HENT:     Head: Normocephalic and atraumatic.  Eyes:     Conjunctiva/sclera: Conjunctivae normal.  Cardiovascular:     Rate and Rhythm: Normal rate and regular rhythm.     Heart sounds: No murmur heard.   Pulmonary:     Effort: Pulmonary effort is normal. No respiratory distress.     Breath sounds: Normal breath sounds.  Abdominal:     Palpations: Abdomen is soft.     Tenderness: There is no abdominal tenderness.  Musculoskeletal:     Cervical back: Neck supple.  Skin:    General: Skin is warm and dry.  Neurological:     Mental Status: He is alert.     Comments: AAOx3 CN 2-12 intact, speech clear visual fields intact 5/5 strength in b/l UE and LE Sensation to light touch intact in b/l UE and LE Normal FNF  Mildly ataxic gait but able to walk unassisted      ED Results / Procedures / Treatments   Labs (all labs ordered are listed, but only abnormal results are displayed) Labs Reviewed  CBC  WITH DIFFERENTIAL/PLATELET - Abnormal; Notable for the following components:      Result Value   RBC 4.20 (*)    Hemoglobin 12.5 (*)    HCT 37.6 (*)    All other components within normal limits  BASIC METABOLIC PANEL - Abnormal; Notable for the following components:   Glucose, Bld 108 (*)    All other components within normal limits  TROPONIN I (HIGH SENSITIVITY)    EKG EKG Interpretation  Date/Time:  Saturday January 12 2020 08:25:43 EDT Ventricular Rate:  58 PR Interval:    QRS Duration: 100 QT Interval:  432 QTC Calculation: 425 R Axis:   33 Text Interpretation: Sinus rhythm Confirmed by Madalyn Rob [phone]) on 01/12/2020 8:26:45 AM   Radiology MR BRAIN WO CONTRAST  Result Date: 01/12/2020 CLINICAL DATA:  Vertigo EXAM: MRI HEAD WITHOUT CONTRAST TECHNIQUE: Multiplanar, multiecho pulse sequences of the brain and surrounding  structures were obtained without intravenous contrast. COMPARISON:  2017 FINDINGS: Brain: There is no acute infarction or intracranial hemorrhage. There is no intracranial mass, mass effect, or edema. There is no hydrocephalus or extra-axial fluid collection. Prominence of the ventricles and sulci reflects generalized parenchymal volume loss. Patchy and confluent areas of T2 hyperintensity in the supratentorial white matter are nonspecific but probably reflect moderate chronic microvascular ischemic changes. Small chronic infarct of the right thalamus. These findings are similar to the prior study. Vascular: Major vessel flow voids at the skull base are preserved. Skull and upper cervical spine: Normal marrow signal is preserved. Sinuses/Orbits: Trace mucosal thickening.  Orbits are unremarkable. Other: Sella is unremarkable.  Mastoid air cells are clear. IMPRESSION: No evidence of recent infarction, hemorrhage, or mass. Moderate chronic microvascular ischemic changes. Electronically Signed   By: Macy Mis M.D.   On: 01/12/2020 13:47     Procedures Procedures (including critical care time)  Medications Ordered in ED Medications  sodium chloride 0.9 % bolus 1,000 mL (0 mLs Intravenous Stopped 01/12/20 1234)  prochlorperazine (COMPAZINE) injection 10 mg (10 mg Intravenous Given 01/12/20 0910)  diphenhydrAMINE (BENADRYL) injection 25 mg (25 mg Intravenous Given 01/12/20 7829)    ED Course  I have reviewed the triage vital signs and the nursing notes.  Pertinent labs & imaging results that were available during my care of the patient were reviewed by me and considered in my medical decision making (see chart for details).  Clinical Course as of Jan 12 854  Sat Jan 12, 2020  1405 Rechecked, symptoms resolved, will discharge home   [RD]    Clinical Course User Index [RD] Lucrezia Starch, MD   MDM Rules/Calculators/A&P                          74 year old male presents to ER with concern for dizziness, headache.  No fever or neck stiffness.  Has history of vertigo but episode today seemed worse than normal.  He had a nonfocal neurologic exam although he had mild difficulty walking initially.  Given reported change from his regular vertigo, proceeded with MRI to rule out acute stroke or other central cause.  MRI was negative.  After patient was provided symptomatic control, his symptoms completely resolved.  While patient was on monitor, heart rate dipped into the 50s, mostly while patient was sleeping.  Normal blood pressure, suspect this was incidental.  Recommend he follow-up with his primary doctor regarding this finding. Rremained well-appearing on reassessment and was discharged home.    After the discussed management above, the patient was determined to be safe for discharge.  The patient was in agreement with this plan and all questions regarding their care were answered.  ED return precautions were discussed and the patient will return to the ED with any significant worsening of condition.  Final Clinical  Impression(s) / ED Diagnoses Final diagnoses:  Dizziness    Rx / DC Orders ED Discharge Orders    None       Lucrezia Starch, MD 01/13/20 [phone]

## 2020-01-12 NOTE — Discharge Instructions (Signed)
Please schedule follow-up appointment with primary doctor regarding symptoms you are experiencing today.  Take meclizine as needed for vertigo.  If you develop worsening difficulty walking, vomiting, fever, chest pain, shortness of breath, other new concerning symptom, please return to ER for reassessment.

## 2020-01-24 ENCOUNTER — Other Ambulatory Visit: Payer: Self-pay

## 2020-01-24 NOTE — Patient Outreach (Signed)
  [facility]) Care Management Chronic Special Needs Program  01/24/2020  Name: Aaron Blevins DOB: 12-15-1945  MRN: [id number]  Mr. Aaron Blevins is enrolled in a chronic special needs plan for Diabetes. RNCM received return call from client. Health risk assessment completed.   Subjective: Client reports he is doing well. He reports he has been to provider visits as scheduled. Last A1C was 7.0. Last visit with primary care was 01/03/20. Client denies any questions or concerns at this time. No care management needs at this time.  Goals Addressed            This Visit's Progress   . COMPLETED: Client will not report change from baseline and no repeated symptoms of stroke with in the next 6 months       Reports no change, no signs or symptoms  Stroke Symptoms-Spot a stroke F.A.S.T. FACE DROOPING Does one side of the face droop or is it numb? Ask the person to smile. ARM WEAKNESS Is one arm weak or numb? Ask the person to raise both arms. Does one arm drift downward? SPEECH DIFFICULTY Is speech slurred, are they unable to speak, or are they hard to understand? Ask the person to repeat a simple sentence, like "the sky is blue." Is the sentence repeated correctly? TIME TO CALL 9-1-1 If the person shows any of these symptoms, even if the symptoms go away, call 9-1-1 and get them to the hospital immediately.      . COMPLETED: Client will verbalize knowledge of self management of Hypertension as evidences by BP reading of 140/90 or less; or as defined by provider       Attends provider visits as scheduled, takes medications as prescribed. Reports blood pressure less than 140/90.    Marland Kitchen HEMOGLOBIN A1C < 7       A1C 12/27/19 7.1 Great Job!  Continue Diabetes self management actions:  Glucose monitoring per provider recommendations  Eat Healthy-low carbohydrate and low salt meals, watch portion sizes and avoid sugar sweetened drinks.   Visit provider every 3-6 months as  directed  Hbg A1C level every 3-6 months.  Take medications as prescribed.    . COMPLETED: Obtain annual  Lipid Profile, LDL-C       Done 12/27/19    . COMPLETED: Obtain Annual Eye (retinal)  Exam        Done 12/12/19    . COMPLETED: Obtain Annual Foot Exam       Done 06/28/19    . COMPLETED: Obtain annual screen for micro albuminuria (urine) , nephropathy (kidney problems)       Completed 05/17/19    . COMPLETED: Obtain Hemoglobin A1C at least 2 times per year       A1C 7.1 on 12/27/19;  A1C 7.5 on 09/26/19    . COMPLETED: Visit Primary Care Provider or Endocrinologist at least 2 times per year        Completed 01/03/20; 10/18/19 and 06/28/19       Warm transfer to health care concierge regarding OTC benefiit card.  Plan: send updated care plan to client, send updated care plan to primary care provider. Next outreach within the next 6 months.  Aaron Sheriff, RN, MSN, [facility] Chronic Care Management Coordinator [facility] [phone]

## 2020-01-24 NOTE — Patient Outreach (Signed)
  [facility]) Care Management Chronic Special Needs Program    01/24/2020  Name: Aaron Blevins, DOB: 1945-11-02  MRN: [id number]   Mr. Aaron Blevins is enrolled in a chronic special needs plan for Diabetes. RNCM called to follow up, assess and update care plan. No answer. HIPAA compliant message left.  Plan: RNCM will continue to outreach.  Kathyrn Sheriff, RN, MSN, [facility] Chronic Care Management Coordinator [facility] [phone]

## 2020-01-25 ENCOUNTER — Ambulatory Visit: Payer: Self-pay

## 2020-01-25 DIAGNOSIS — M19011 Primary osteoarthritis, right shoulder: Secondary | ICD-10-CM | POA: Diagnosis not present

## 2020-01-25 DIAGNOSIS — M25511 Pain in right shoulder: Secondary | ICD-10-CM | POA: Diagnosis not present

## 2020-04-03 ENCOUNTER — Other Ambulatory Visit: Payer: Self-pay

## 2020-04-03 DIAGNOSIS — H40013 Open angle with borderline findings, low risk, bilateral: Secondary | ICD-10-CM | POA: Diagnosis not present

## 2020-04-03 DIAGNOSIS — E119 Type 2 diabetes mellitus without complications: Secondary | ICD-10-CM | POA: Diagnosis not present

## 2020-04-03 NOTE — Patient Outreach (Signed)
  [facility]) Care Management Chronic Special Needs Program    04/03/2020  Name: Aaron Blevins, DOB: [birth year]  MRN: [id number]   Mr. [name] is enrolled in a chronic special needs plan for Diabetes. [facility] Care Management will continue to provide services for this member through 05/16/2020. The [facility] [employer] Care Management Team will assume care 05/17/2020.  Kathyrn Sheriff, RN, MSN, [facility] Chronic Care Management Coordinator [facility] [phone]

## 2020-04-15 DIAGNOSIS — E1165 Type 2 diabetes mellitus with hyperglycemia: Secondary | ICD-10-CM | POA: Diagnosis not present

## 2020-04-15 DIAGNOSIS — E782 Mixed hyperlipidemia: Secondary | ICD-10-CM | POA: Diagnosis not present

## 2020-04-15 DIAGNOSIS — I10 Essential (primary) hypertension: Secondary | ICD-10-CM | POA: Diagnosis not present

## 2020-04-15 DIAGNOSIS — R809 Proteinuria, unspecified: Secondary | ICD-10-CM | POA: Diagnosis not present

## 2020-04-18 DIAGNOSIS — E1165 Type 2 diabetes mellitus with hyperglycemia: Secondary | ICD-10-CM | POA: Diagnosis not present

## 2020-04-18 DIAGNOSIS — E782 Mixed hyperlipidemia: Secondary | ICD-10-CM | POA: Diagnosis not present

## 2020-04-18 DIAGNOSIS — Z794 Long term (current) use of insulin: Secondary | ICD-10-CM | POA: Diagnosis not present

## 2020-04-18 DIAGNOSIS — I10 Essential (primary) hypertension: Secondary | ICD-10-CM | POA: Diagnosis not present

## 2020-05-23 ENCOUNTER — Other Ambulatory Visit: Payer: Self-pay

## 2020-07-14 DIAGNOSIS — H539 Unspecified visual disturbance: Secondary | ICD-10-CM | POA: Diagnosis not present

## 2020-07-14 DIAGNOSIS — H81399 Other peripheral vertigo, unspecified ear: Secondary | ICD-10-CM | POA: Diagnosis not present

## 2020-07-14 DIAGNOSIS — Z794 Long term (current) use of insulin: Secondary | ICD-10-CM | POA: Diagnosis not present

## 2020-07-14 DIAGNOSIS — Z87891 Personal history of nicotine dependence: Secondary | ICD-10-CM | POA: Diagnosis not present

## 2020-07-14 DIAGNOSIS — Z125 Encounter for screening for malignant neoplasm of prostate: Secondary | ICD-10-CM | POA: Diagnosis not present

## 2020-07-14 DIAGNOSIS — E782 Mixed hyperlipidemia: Secondary | ICD-10-CM | POA: Diagnosis not present

## 2020-07-14 DIAGNOSIS — H903 Sensorineural hearing loss, bilateral: Secondary | ICD-10-CM | POA: Diagnosis not present

## 2020-07-14 DIAGNOSIS — Z79899 Other long term (current) drug therapy: Secondary | ICD-10-CM | POA: Diagnosis not present

## 2020-07-14 DIAGNOSIS — D649 Anemia, unspecified: Secondary | ICD-10-CM | POA: Diagnosis not present

## 2020-07-14 DIAGNOSIS — Z7984 Long term (current) use of oral hypoglycemic drugs: Secondary | ICD-10-CM | POA: Diagnosis not present

## 2020-07-14 DIAGNOSIS — I10 Essential (primary) hypertension: Secondary | ICD-10-CM | POA: Diagnosis not present

## 2020-07-14 DIAGNOSIS — Z0000 Encounter for general adult medical examination without abnormal findings: Secondary | ICD-10-CM | POA: Diagnosis not present

## 2020-07-14 DIAGNOSIS — E785 Hyperlipidemia, unspecified: Secondary | ICD-10-CM | POA: Diagnosis not present

## 2020-07-14 DIAGNOSIS — R350 Frequency of micturition: Secondary | ICD-10-CM | POA: Diagnosis not present

## 2020-07-14 DIAGNOSIS — K5900 Constipation, unspecified: Secondary | ICD-10-CM | POA: Diagnosis not present

## 2020-07-14 DIAGNOSIS — Z8673 Personal history of transient ischemic attack (TIA), and cerebral infarction without residual deficits: Secondary | ICD-10-CM | POA: Diagnosis not present

## 2020-07-14 DIAGNOSIS — K219 Gastro-esophageal reflux disease without esophagitis: Secondary | ICD-10-CM | POA: Diagnosis not present

## 2020-07-14 DIAGNOSIS — E1165 Type 2 diabetes mellitus with hyperglycemia: Secondary | ICD-10-CM | POA: Diagnosis not present

## 2020-07-14 DIAGNOSIS — R1312 Dysphagia, oropharyngeal phase: Secondary | ICD-10-CM | POA: Diagnosis not present

## 2020-07-14 DIAGNOSIS — N401 Enlarged prostate with lower urinary tract symptoms: Secondary | ICD-10-CM | POA: Diagnosis not present

## 2020-07-14 DIAGNOSIS — Z0001 Encounter for general adult medical examination with abnormal findings: Secondary | ICD-10-CM | POA: Diagnosis not present

## 2020-07-28 DIAGNOSIS — R1312 Dysphagia, oropharyngeal phase: Secondary | ICD-10-CM | POA: Diagnosis not present

## 2020-08-04 DIAGNOSIS — R1314 Dysphagia, pharyngoesophageal phase: Secondary | ICD-10-CM | POA: Diagnosis not present

## 2020-08-04 DIAGNOSIS — K219 Gastro-esophageal reflux disease without esophagitis: Secondary | ICD-10-CM | POA: Diagnosis not present

## 2020-08-14 DIAGNOSIS — R1310 Dysphagia, unspecified: Secondary | ICD-10-CM | POA: Diagnosis not present

## 2020-08-14 DIAGNOSIS — K222 Esophageal obstruction: Secondary | ICD-10-CM | POA: Diagnosis not present

## 2020-08-14 DIAGNOSIS — K449 Diaphragmatic hernia without obstruction or gangrene: Secondary | ICD-10-CM | POA: Diagnosis not present

## 2020-08-14 DIAGNOSIS — R1314 Dysphagia, pharyngoesophageal phase: Secondary | ICD-10-CM | POA: Diagnosis not present

## 2020-09-11 DIAGNOSIS — R1314 Dysphagia, pharyngoesophageal phase: Secondary | ICD-10-CM | POA: Diagnosis not present

## 2020-09-11 DIAGNOSIS — K222 Esophageal obstruction: Secondary | ICD-10-CM | POA: Diagnosis not present

## 2020-09-11 DIAGNOSIS — R195 Other fecal abnormalities: Secondary | ICD-10-CM | POA: Diagnosis not present

## 2020-09-11 DIAGNOSIS — R5383 Other fatigue: Secondary | ICD-10-CM | POA: Diagnosis not present

## 2020-09-12 DIAGNOSIS — K208 Other esophagitis without bleeding: Secondary | ICD-10-CM | POA: Diagnosis not present

## 2020-09-12 DIAGNOSIS — K209 Esophagitis, unspecified without bleeding: Secondary | ICD-10-CM | POA: Diagnosis not present

## 2020-09-12 DIAGNOSIS — K449 Diaphragmatic hernia without obstruction or gangrene: Secondary | ICD-10-CM | POA: Diagnosis not present

## 2020-09-12 DIAGNOSIS — K222 Esophageal obstruction: Secondary | ICD-10-CM | POA: Diagnosis not present

## 2020-09-12 DIAGNOSIS — R1314 Dysphagia, pharyngoesophageal phase: Secondary | ICD-10-CM | POA: Diagnosis not present

## 2020-10-14 DIAGNOSIS — Z0000 Encounter for general adult medical examination without abnormal findings: Secondary | ICD-10-CM | POA: Diagnosis not present

## 2020-10-14 DIAGNOSIS — E782 Mixed hyperlipidemia: Secondary | ICD-10-CM | POA: Diagnosis not present

## 2020-10-14 DIAGNOSIS — K5900 Constipation, unspecified: Secondary | ICD-10-CM | POA: Diagnosis not present

## 2020-10-14 DIAGNOSIS — D649 Anemia, unspecified: Secondary | ICD-10-CM | POA: Diagnosis not present

## 2020-10-14 DIAGNOSIS — H81399 Other peripheral vertigo, unspecified ear: Secondary | ICD-10-CM | POA: Diagnosis not present

## 2020-10-14 DIAGNOSIS — H903 Sensorineural hearing loss, bilateral: Secondary | ICD-10-CM | POA: Diagnosis not present

## 2020-10-14 DIAGNOSIS — K219 Gastro-esophageal reflux disease without esophagitis: Secondary | ICD-10-CM | POA: Diagnosis not present

## 2020-10-14 DIAGNOSIS — E1165 Type 2 diabetes mellitus with hyperglycemia: Secondary | ICD-10-CM | POA: Diagnosis not present

## 2020-10-14 DIAGNOSIS — R1312 Dysphagia, oropharyngeal phase: Secondary | ICD-10-CM | POA: Diagnosis not present

## 2020-10-14 DIAGNOSIS — R079 Chest pain, unspecified: Secondary | ICD-10-CM | POA: Diagnosis not present

## 2020-10-14 DIAGNOSIS — R5383 Other fatigue: Secondary | ICD-10-CM | POA: Diagnosis not present

## 2020-10-14 DIAGNOSIS — N401 Enlarged prostate with lower urinary tract symptoms: Secondary | ICD-10-CM | POA: Diagnosis not present

## 2020-10-14 DIAGNOSIS — Z7984 Long term (current) use of oral hypoglycemic drugs: Secondary | ICD-10-CM | POA: Diagnosis not present

## 2020-10-14 DIAGNOSIS — Z87891 Personal history of nicotine dependence: Secondary | ICD-10-CM | POA: Diagnosis not present

## 2020-10-14 DIAGNOSIS — Z794 Long term (current) use of insulin: Secondary | ICD-10-CM | POA: Diagnosis not present

## 2020-10-14 DIAGNOSIS — K222 Esophageal obstruction: Secondary | ICD-10-CM | POA: Diagnosis not present

## 2020-10-14 DIAGNOSIS — R350 Frequency of micturition: Secondary | ICD-10-CM | POA: Diagnosis not present

## 2020-10-14 DIAGNOSIS — R2689 Other abnormalities of gait and mobility: Secondary | ICD-10-CM | POA: Diagnosis not present

## 2020-10-14 DIAGNOSIS — I10 Essential (primary) hypertension: Secondary | ICD-10-CM | POA: Diagnosis not present

## 2020-10-25 IMAGING — MR MR HEAD W/O CM
10 of 11 series · 36 of 48 positions shown · non-contrast
Comparison: 9595

CLINICAL DATA: Vertigo

EXAM:
MRI HEAD WITHOUT CONTRAST
TECHNIQUE: Multiplanar, multiecho pulse sequences of the brain and surrounding
structures were obtained without intravenous contrast.

[Series 2: T1 · sagittal · 5.0mm · 0.49mm/px · 3 of 23 slices shown]
[im 1/23]
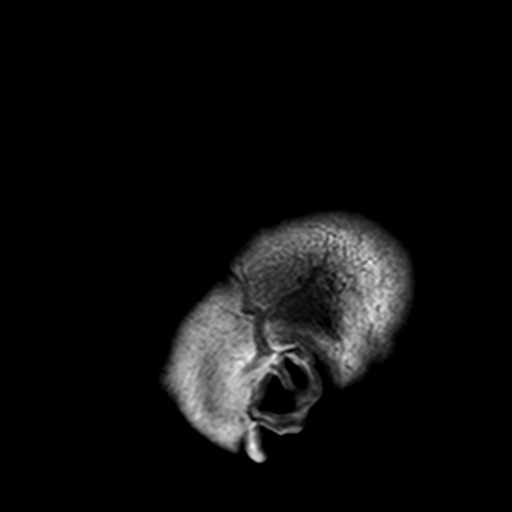
[im 12/23]
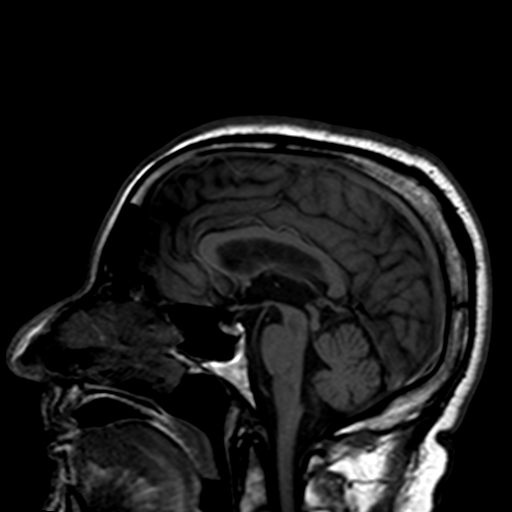
[im 23/23]
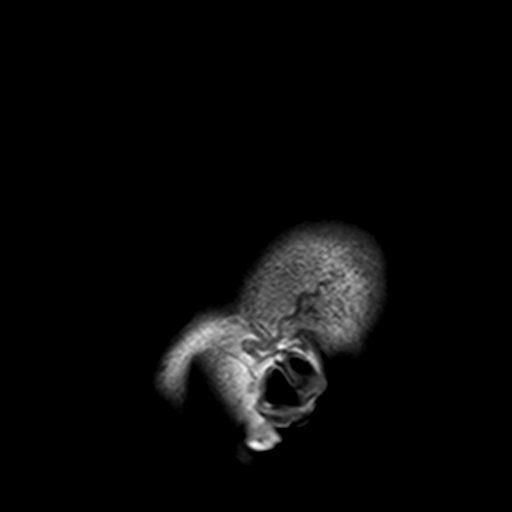

[Series 3: DWI · axial · 3.0mm · 2.00mm/px · z∈[-50,+101]mm · 9 of 104 slices shown (1 of 4)]
[im 1/104]
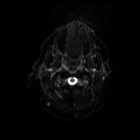
[im 13/104]
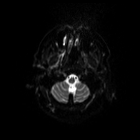
[im 26/104]
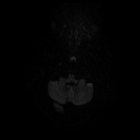
[im 39/104]
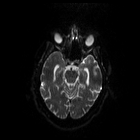
[im 52/104]
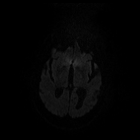
[im 65/104]
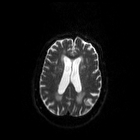
[im 78/104]
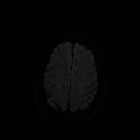
[im 91/104]
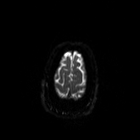
[im 104/104]
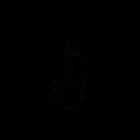

[Series 4: DWI · axial · 3.0mm · 2.00mm/px · z∈[-50,+101]mm · 4 of 52 slices shown (2 of 4)]
[im 1/52]
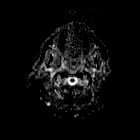
[im 18/52]
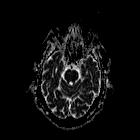
[im 35/52]
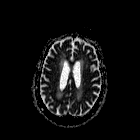
[im 52/52]
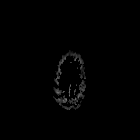

[Series 5: DWI · coronal · 5.0mm · 1.46mm/px · 7 of 76 slices shown (3 of 4)]
[im 1/76]
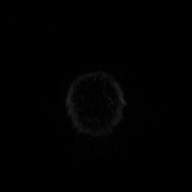
[im 13/76]
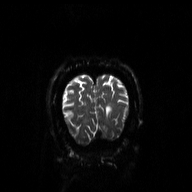
[im 26/76]
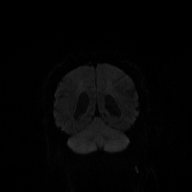
[im 38/76]
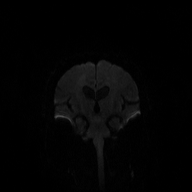
[im 51/76]
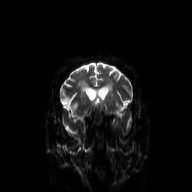
[im 63/76]
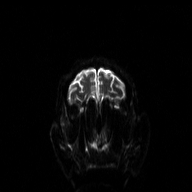
[im 76/76]
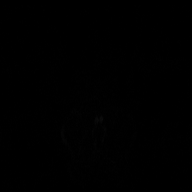

[Series 6: DWI · coronal · 5.0mm · 1.46mm/px · 3 of 38 slices shown (4 of 4)]
[im 1/38]
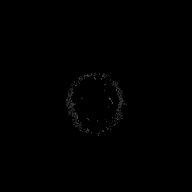
[im 19/38]
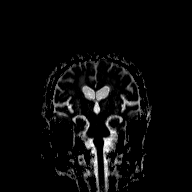
[im 38/38]
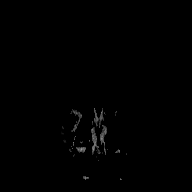

[Series 7: T2 · axial · 5.0mm · 0.90mm/px · z∈[-44,+105]mm · 2 of 23 slices shown (1 of 2)]
[im 1/23]
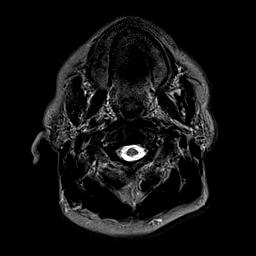
[im 23/23]
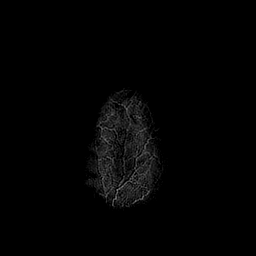

[Series 8: FLAIR · axial · 3.0mm · 0.90mm/px · z∈[-44,+100]mm · 3 of 38 slices shown]
[im 1/38]
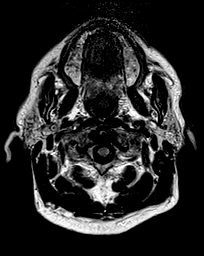
[im 19/38]
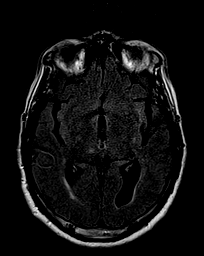
[im 38/38]
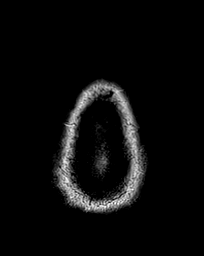

[Series 9: T2 · axial · 5.0mm · 0.45mm/px · z∈[-44,+106]mm · 2 of 23 slices shown (2 of 2)]
[im 1/23]
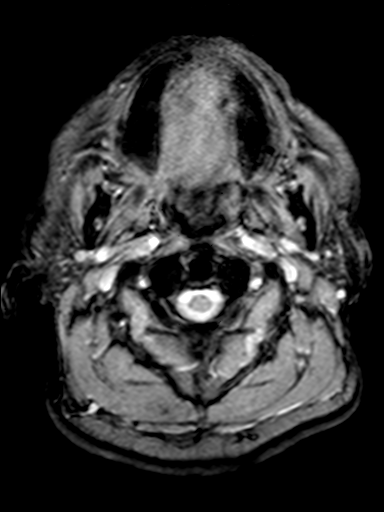
[im 23/23]
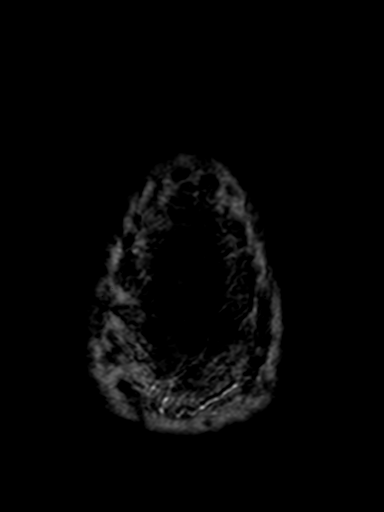

[Series 11: T2 post-contrast · coronal · 5.0mm · 0.45mm/px · 2 of 28 slices shown]
[im 1/28]
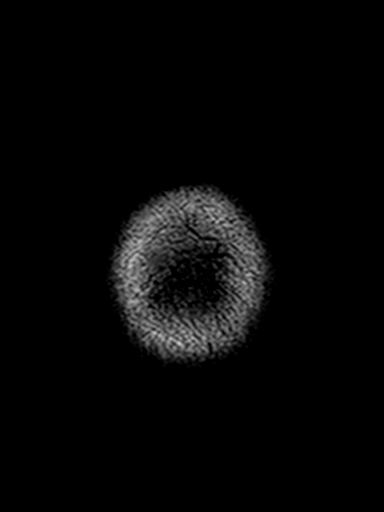
[im 28/28]
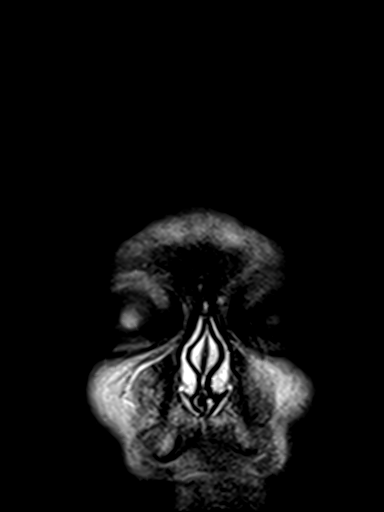

[Series 100: hx · axial · 10.0mm · 0.55mm/px · 1 of 11 slices shown]
[im 1/11]
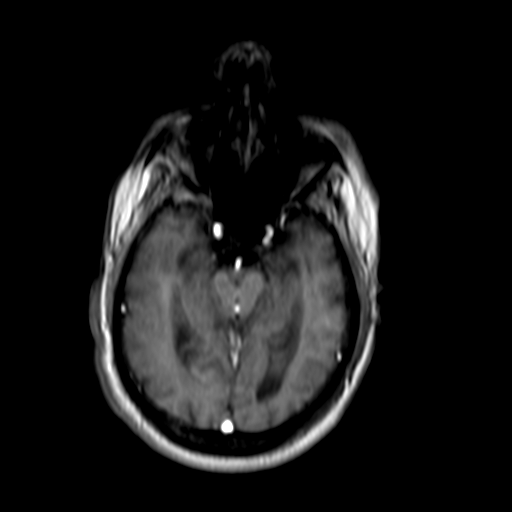

[36 of 48 positions shown; findings below may reference images not displayed]

FINDINGS: Brain: There is no acute infarction or intracranial hemorrhage.
There is no intracranial mass, mass effect, or edema. There is no
hydrocephalus or extra-axial fluid collection.

Prominence of the ventricles and sulci reflects generalized
parenchymal volume loss. Patchy and confluent areas of T2
hyperintensity in the supratentorial white matter are nonspecific
but probably reflect moderate chronic microvascular ischemic
changes. Small chronic infarct of the right thalamus. These findings
are similar to the prior study.

Vascular: Major vessel flow voids at the skull base are preserved.

Skull and upper cervical spine: Normal marrow signal is preserved.

Sinuses/Orbits: Trace mucosal thickening.  Orbits are unremarkable.

Other: Sella is unremarkable.  Mastoid air cells are clear.
IMPRESSION: No evidence of recent infarction, hemorrhage, or mass.

Moderate chronic microvascular ischemic changes.

## 2020-12-02 DIAGNOSIS — H40023 Open angle with borderline findings, high risk, bilateral: Secondary | ICD-10-CM | POA: Diagnosis not present

## 2020-12-02 DIAGNOSIS — E119 Type 2 diabetes mellitus without complications: Secondary | ICD-10-CM | POA: Diagnosis not present

## 2021-01-14 DIAGNOSIS — E559 Vitamin D deficiency, unspecified: Secondary | ICD-10-CM | POA: Diagnosis not present

## 2021-01-14 DIAGNOSIS — I10 Essential (primary) hypertension: Secondary | ICD-10-CM | POA: Diagnosis not present

## 2021-01-14 DIAGNOSIS — L299 Pruritus, unspecified: Secondary | ICD-10-CM | POA: Diagnosis not present

## 2021-01-14 DIAGNOSIS — E782 Mixed hyperlipidemia: Secondary | ICD-10-CM | POA: Diagnosis not present

## 2021-01-14 DIAGNOSIS — E1165 Type 2 diabetes mellitus with hyperglycemia: Secondary | ICD-10-CM | POA: Diagnosis not present

## 2021-01-14 DIAGNOSIS — Z794 Long term (current) use of insulin: Secondary | ICD-10-CM | POA: Diagnosis not present

## 2021-01-14 DIAGNOSIS — D649 Anemia, unspecified: Secondary | ICD-10-CM | POA: Diagnosis not present

## 2021-01-21 ENCOUNTER — Other Ambulatory Visit: Payer: Self-pay

## 2021-01-21 ENCOUNTER — Emergency Department (HOSPITAL_BASED_OUTPATIENT_CLINIC_OR_DEPARTMENT_OTHER)
Admission: EM | Admit: 2021-01-21 | Discharge: 2021-01-21 | Disposition: A | Discharge status: Home / Self Care | Payer: HMO | Attending: Emergency Medicine | Admitting: Emergency Medicine

## 2021-01-21 ENCOUNTER — Emergency Department (HOSPITAL_BASED_OUTPATIENT_CLINIC_OR_DEPARTMENT_OTHER): Payer: HMO

## 2021-01-21 ENCOUNTER — Encounter (HOSPITAL_BASED_OUTPATIENT_CLINIC_OR_DEPARTMENT_OTHER): Payer: Self-pay

## 2021-01-21 DIAGNOSIS — M47817 Spondylosis without myelopathy or radiculopathy, lumbosacral region: Secondary | ICD-10-CM | POA: Diagnosis not present

## 2021-01-21 DIAGNOSIS — I10 Essential (primary) hypertension: Secondary | ICD-10-CM | POA: Diagnosis not present

## 2021-01-21 DIAGNOSIS — R109 Unspecified abdominal pain: Secondary | ICD-10-CM | POA: Diagnosis not present

## 2021-01-21 DIAGNOSIS — R1031 Right lower quadrant pain: Secondary | ICD-10-CM | POA: Diagnosis not present

## 2021-01-21 DIAGNOSIS — K219 Gastro-esophageal reflux disease without esophagitis: Secondary | ICD-10-CM | POA: Insufficient documentation

## 2021-01-21 DIAGNOSIS — Z87891 Personal history of nicotine dependence: Secondary | ICD-10-CM | POA: Diagnosis not present

## 2021-01-21 DIAGNOSIS — K449 Diaphragmatic hernia without obstruction or gangrene: Secondary | ICD-10-CM | POA: Diagnosis not present

## 2021-01-21 DIAGNOSIS — K429 Umbilical hernia without obstruction or gangrene: Secondary | ICD-10-CM | POA: Diagnosis not present

## 2021-01-21 DIAGNOSIS — E119 Type 2 diabetes mellitus without complications: Secondary | ICD-10-CM | POA: Diagnosis not present

## 2021-01-21 DIAGNOSIS — M4316 Spondylolisthesis, lumbar region: Secondary | ICD-10-CM | POA: Diagnosis not present

## 2021-01-21 LAB — CBC WITH DIFFERENTIAL/PLATELET
Abs Immature Granulocytes: 0.01 10*3/uL (ref 0.00–0.07)
Basophils Absolute: 0 10*3/uL (ref 0.0–0.1)
Basophils Relative: 1 %
Eosinophils Absolute: 0.3 10*3/uL (ref 0.0–0.5)
Eosinophils Relative: 4 %
HCT: 39.3 % (ref 39.0–52.0)
Hemoglobin: 13.4 g/dL (ref 13.0–17.0)
Immature Granulocytes: 0 %
Lymphocytes Relative: 25 %
Lymphs Abs: 1.9 10*3/uL (ref 0.7–4.0)
MCH: 30.3 pg (ref 26.0–34.0)
MCHC: 34.1 g/dL (ref 30.0–36.0)
MCV: 88.9 fL (ref 80.0–100.0)
Monocytes Absolute: 0.8 10*3/uL (ref 0.1–1.0)
Monocytes Relative: 10 %
Neutro Abs: 4.6 10*3/uL (ref 1.7–7.7)
Neutrophils Relative %: 60 %
Platelets: 171 10*3/uL (ref 150–400)
RBC: 4.42 MIL/uL (ref 4.22–5.81)
RDW: 12.9 % (ref 11.5–15.5)
WBC: 7.7 10*3/uL (ref 4.0–10.5)
nRBC: 0 % (ref 0.0–0.2)

## 2021-01-21 LAB — COMPREHENSIVE METABOLIC PANEL
ALT: 10 U/L (ref 0–44)
AST: 18 U/L (ref 15–41)
Albumin: 4.2 g/dL (ref 3.5–5.0)
Alkaline Phosphatase: 74 U/L (ref 38–126)
Anion gap: 7 (ref 5–15)
BUN: 18 mg/dL (ref 8–23)
CO2: 28 mmol/L (ref 22–32)
Calcium: 9.4 mg/dL (ref 8.9–10.3)
Chloride: 100 mmol/L (ref 98–111)
Creatinine, Ser: 1.13 mg/dL (ref 0.61–1.24)
GFR, Estimated: >60 mL/min (ref >60)
Glucose, Bld: 155 mg/dL — ABNORMAL HIGH (ref 70–99)
Potassium: 4.3 mmol/L (ref 3.5–5.1)
Sodium: 135 mmol/L (ref 135–145)
Total Bilirubin: 0.6 mg/dL (ref 0.3–1.2)
Total Protein: 7.7 g/dL (ref 6.5–8.1)

## 2021-01-21 LAB — LIPASE, BLOOD: Lipase: 29 U/L (ref 11–51)

## 2021-01-21 MED ORDER — FENTANYL CITRATE PF 50 MCG/ML IJ SOSY
50.0000 ug | PREFILLED_SYRINGE | Freq: Once | INTRAMUSCULAR | Status: AC
  Administered 2021-01-21: 50 ug via INTRAVENOUS
  Filled 2021-01-21: qty 1

## 2021-01-21 MED ORDER — IOHEXOL 350 MG/ML SOLN
85.0000 mL | Freq: Once | INTRAVENOUS | Status: AC | PRN
  Administered 2021-01-21: 85 mL via INTRAVENOUS

## 2021-01-21 MED ORDER — ONDANSETRON HCL 4 MG/2ML IJ SOLN
4.0000 mg | Freq: Once | INTRAMUSCULAR | Status: AC
  Administered 2021-01-21: 4 mg via INTRAVENOUS
  Filled 2021-01-21: qty 2

## 2021-01-21 MED ORDER — DICLOFENAC SODIUM 1 % EX GEL: 2.0000 g | Freq: Four times a day (QID) | CUTANEOUS | 0 refills | Status: AC | PRN

## 2021-01-21 MED ORDER — DOCUSATE SODIUM 250 MG PO CAPS: 250.0000 mg | ORAL_CAPSULE | Freq: Every day | ORAL | 0 refills | Status: AC | PRN

## 2021-01-21 NOTE — Discharge Instructions (Addendum)

## 2021-01-21 NOTE — ED Triage Notes (Signed)
Pt c/o RLQ pain x 2-3 days-states he was seen/sent from [facility]-steady gait

## 2021-01-21 NOTE — ED Provider Notes (Signed)
Emergency Department Provider Note   I have reviewed the triage vital signs and the nursing notes.   HISTORY  Chief Complaint Abdominal Pain   HPI Aaron Blevins is a 75 y.o. male with PMH reviewed below presents to the ED with 2 days of RLQ abdominal and flank pain. Pain is moderate and constant. No CP or SOB. No fever. No diarrhea or vomiting. No nausea. No similar symptoms in the past. No dysuria, hesitancy, or urgency. He was initially seen at [facility] and referred here for labs and imaging.   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   Stroke (HCC) 10/19/2011   DM (diabetes mellitus) (HCC) 10/19/2011   HTN (hypertension) 10/19/2011   GERD (gastroesophageal reflux disease) 10/19/2011    Past Surgical History:  Procedure Laterality Date   BALLOON DILATION  04/12/2012   Procedure: BALLOON DILATION;  Surgeon: Willis Modena, MD;  Location: [facility];  Service: Endoscopy;  Laterality: N/A;   CIRCUMCISION     ESOPHAGOGASTRODUODENOSCOPY (EGD) WITH PROPOFOL  04/12/2012   Procedure: ESOPHAGOGASTRODUODENOSCOPY (EGD) WITH PROPOFOL;  Surgeon: Willis Modena, MD;  Location: [facility];  Service: Endoscopy;  Laterality: N/A;    Allergies Patient has no known allergies.  No family history on file.  Social History Social History   Tobacco Use   Smoking status: Former   Smokeless tobacco: Never  Building services engineer Use: Never used  Substance Use Topics   Alcohol use: No   Drug use: No    Review of Systems  Constitutional: No fever/chills Eyes: No visual changes. ENT: No sore throat. Cardiovascular: Denies chest pain. Respiratory: Denies  shortness of breath. Gastrointestinal: Positive RLQ abdominal pain.  No nausea, no vomiting.  No diarrhea.  No constipation. Genitourinary: Negative for dysuria. Musculoskeletal: Negative for back pain. Skin: Negative for rash. Neurological: Negative for headaches, focal weakness or numbness.  10-point ROS otherwise negative.  ____________________________________________   PHYSICAL EXAM:  VITAL SIGNS: ED Triage Vitals  Enc Vitals Group     BP 01/21/21 1925 (!) 210/111     Pulse Rate 01/21/21 1925 (!) 57     Resp 01/21/21 1925 20     Temp 01/21/21 1925 98.2 F (36.8 C)     Temp Source 01/21/21 1925 Oral     SpO2 01/21/21 1925 98 %     Weight 01/21/21 1924 173 lb (78.5 kg)     Height 01/21/21 1924 5\' 8"  (1.727 m)    Constitutional: Alert and oriented. Well appearing and in no acute distress. Eyes: Conjunctivae are normal.  Head: Atraumatic. Nose: No congestion/rhinnorhea. Mouth/Throat: Mucous membranes are moist.  Neck: No stridor.  Cardiovascular: Normal rate, regular rhythm. Good peripheral circulation. Grossly normal heart sounds.   Respiratory: Normal respiratory effort.  No retractions. Lungs CTAB. Gastrointestinal: Soft with mild tenderness in the RLQ. No rebound or guarding. No tenderness in remaining quadrants. No distention.  Musculoskeletal: No lower extremity tenderness nor edema. No gross deformities of extremities. Neurologic:  Normal speech and language. No gross focal neurologic deficits are appreciated.  Skin:  Skin is warm, dry and intact. No rash noted.  ____________________________________________   LABS (all labs ordered are listed, but only abnormal results are displayed)  Labs Reviewed  COMPREHENSIVE METABOLIC PANEL - Abnormal; Notable for the following components:      Result Value   Glucose, Bld 155 (*)    All other components within normal limits  LIPASE, BLOOD  CBC WITH DIFFERENTIAL/PLATELET    ____________________________________________  RADIOLOGY  CT ABDOMEN PELVIS W CONTRAST  Result Date: 01/21/2021 CLINICAL DATA:  Right lower quadrant EXAM: CT ABDOMEN AND PELVIS WITH CONTRAST TECHNIQUE: Multidetector CT imaging of the abdomen and pelvis was performed using the standard protocol following bolus administration of intravenous contrast. CONTRAST:  15mL OMNIPAQUE IOHEXOL 350 MG/ML SOLN COMPARISON:  CT 02/26/2014 FINDINGS: Lower chest: Mild subpleural reticulation consistent with fibrosis. No acute airspace disease or effusion. Small hiatal hernia. Hepatobiliary: No focal liver abnormality is seen. No gallstones, gallbladder wall thickening, or biliary dilatation. Pancreas: Unremarkable. No pancreatic ductal dilatation or surrounding inflammatory changes. Spleen: Normal in size without focal abnormality. Adrenals/Urinary Tract: Adrenal glands are unremarkable. Kidneys are normal, without renal calculi, focal lesion, or hydronephrosis. Bladder is unremarkable. Stomach/Bowel: Stomach is within normal limits. Appendix appears normal. No evidence of bowel wall thickening, distention, or inflammatory changes. Vascular/Lymphatic: Mild aortic atherosclerosis. No aneurysm. No suspicious nodes Reproductive: Prostate is unremarkable. Other: Negative for pelvic effusion or free air. Small fat containing umbilical hernia Musculoskeletal: No acute or significant osseous findings. Grade 1 anterolisthesis L4 on L5. Degenerative changes at L5-S1 IMPRESSION: No CT evidence for acute intra-abdominopelvic abnormality. Negative for acute appendicitis Electronically Signed   By: Jasmine Pang M.D.   On: 01/21/2021 20:52    ____________________________________________   PROCEDURES  Procedure(s) performed:   Procedures  None ____________________________________________   INITIAL IMPRESSION / ASSESSMENT AND PLAN / ED COURSE  Pertinent labs & imaging results that were available during my care of the  patient were reviewed by me and considered in my medical decision making (see chart for details).   Patient presents to the ED with RLQ abdominal pain.   Differential diagnosis includes but is not exclusive to acute appendicitis, renal colic, testicular torsion, urinary tract infection, prostatitis,  diverticulitis, small bowel obstruction, colitis, abdominal aortic aneurysm, gastroenteritis, constipation etc.  Plan for labs and CT abdomen/pelvis with IV contrast.   09:10 PM  CT abdomen/pelvis reviewed with no acute findings. Pain has a MSK component with worsening symptoms with sitting up/movement. No rash to suspect Zoster but discussed that if a rash develops he will need to seek care/treatment. Plan for colace and topical NSAIDs for treatment with plan for OTC Tylenol PRN. Plan for PCP follow up. Patient had a UA done at [facility]. I was able to review those results on paper at bedside. No evidence of UTI or Hb. Will not repeat testing here. Patient stable for d/c.  ____________________________________________  FINAL CLINICAL IMPRESSION(S) / ED DIAGNOSES  Final diagnoses:  Right lower quadrant abdominal pain     MEDICATIONS GIVEN DURING THIS VISIT:  Medications  fentaNYL (SUBLIMAZE) injection 50 mcg (50 mcg Intravenous Given 01/21/21 1950)  ondansetron (ZOFRAN) injection 4 mg (4 mg Intravenous Given 01/21/21 1948)  iohexol (OMNIPAQUE) 350 MG/ML injection 85 mL (85 mLs Intravenous Contrast Given 01/21/21 2025)     NEW OUTPATIENT MEDICATIONS STARTED DURING THIS VISIT:  New Prescriptions   DICLOFENAC SODIUM (VOLTAREN) 1 % GEL    Apply 2 g topically 4 (four) times daily as needed.   DOCUSATE SODIUM (COLACE) 250 MG CAPSULE    Take 1 capsule (250 mg  total) by mouth daily as needed for constipation.    Note:  This document was prepared using Dragon voice recognition software and may include unintentional dictation errors.  Alona Bene, MD, [facility] Emergency Medicine    Mariea Mcmartin, Arlyss Repress,  MD 01/21/21 2121

## 2021-03-05 DIAGNOSIS — H43821 Vitreomacular adhesion, right eye: Secondary | ICD-10-CM | POA: Diagnosis not present

## 2021-03-05 DIAGNOSIS — H43813 Vitreous degeneration, bilateral: Secondary | ICD-10-CM | POA: Diagnosis not present

## 2021-03-05 DIAGNOSIS — E119 Type 2 diabetes mellitus without complications: Secondary | ICD-10-CM | POA: Diagnosis not present

## 2021-03-05 DIAGNOSIS — H40023 Open angle with borderline findings, high risk, bilateral: Secondary | ICD-10-CM | POA: Diagnosis not present

## 2021-03-12 DIAGNOSIS — H5712 Ocular pain, left eye: Secondary | ICD-10-CM | POA: Diagnosis not present

## 2021-04-16 DIAGNOSIS — K222 Esophageal obstruction: Secondary | ICD-10-CM | POA: Diagnosis not present

## 2021-04-16 DIAGNOSIS — R1312 Dysphagia, oropharyngeal phase: Secondary | ICD-10-CM | POA: Diagnosis not present

## 2021-04-16 DIAGNOSIS — R350 Frequency of micturition: Secondary | ICD-10-CM | POA: Diagnosis not present

## 2021-04-16 DIAGNOSIS — H903 Sensorineural hearing loss, bilateral: Secondary | ICD-10-CM | POA: Diagnosis not present

## 2021-04-16 DIAGNOSIS — D649 Anemia, unspecified: Secondary | ICD-10-CM | POA: Diagnosis not present

## 2021-04-16 DIAGNOSIS — E1165 Type 2 diabetes mellitus with hyperglycemia: Secondary | ICD-10-CM | POA: Diagnosis not present

## 2021-04-16 DIAGNOSIS — E559 Vitamin D deficiency, unspecified: Secondary | ICD-10-CM | POA: Diagnosis not present

## 2021-04-16 DIAGNOSIS — H81399 Other peripheral vertigo, unspecified ear: Secondary | ICD-10-CM | POA: Diagnosis not present

## 2021-04-16 DIAGNOSIS — N401 Enlarged prostate with lower urinary tract symptoms: Secondary | ICD-10-CM | POA: Diagnosis not present

## 2021-04-16 DIAGNOSIS — I10 Essential (primary) hypertension: Secondary | ICD-10-CM | POA: Diagnosis not present

## 2021-04-16 DIAGNOSIS — Z8673 Personal history of transient ischemic attack (TIA), and cerebral infarction without residual deficits: Secondary | ICD-10-CM | POA: Diagnosis not present

## 2021-04-16 DIAGNOSIS — K5900 Constipation, unspecified: Secondary | ICD-10-CM | POA: Diagnosis not present

## 2021-04-16 DIAGNOSIS — E782 Mixed hyperlipidemia: Secondary | ICD-10-CM | POA: Diagnosis not present

## 2021-04-16 DIAGNOSIS — K219 Gastro-esophageal reflux disease without esophagitis: Secondary | ICD-10-CM | POA: Diagnosis not present

## 2021-04-16 DIAGNOSIS — Z794 Long term (current) use of insulin: Secondary | ICD-10-CM | POA: Diagnosis not present

## 2021-08-01 ENCOUNTER — Other Ambulatory Visit: Payer: Self-pay

## 2021-08-01 ENCOUNTER — Emergency Department (HOSPITAL_BASED_OUTPATIENT_CLINIC_OR_DEPARTMENT_OTHER)
Admission: EM | Admit: 2021-08-01 | Discharge: 2021-08-01 | Disposition: A | Discharge status: Home / Self Care | Payer: HMO | Attending: Student | Admitting: Student

## 2021-08-01 ENCOUNTER — Encounter (HOSPITAL_BASED_OUTPATIENT_CLINIC_OR_DEPARTMENT_OTHER): Payer: Self-pay | Admitting: Emergency Medicine

## 2021-08-01 ENCOUNTER — Emergency Department (HOSPITAL_BASED_OUTPATIENT_CLINIC_OR_DEPARTMENT_OTHER): Payer: HMO

## 2021-08-01 DIAGNOSIS — E119 Type 2 diabetes mellitus without complications: Secondary | ICD-10-CM | POA: Diagnosis not present

## 2021-08-01 DIAGNOSIS — R42 Dizziness and giddiness: Secondary | ICD-10-CM

## 2021-08-01 DIAGNOSIS — Z79899 Other long term (current) drug therapy: Secondary | ICD-10-CM | POA: Insufficient documentation

## 2021-08-01 DIAGNOSIS — I10 Essential (primary) hypertension: Secondary | ICD-10-CM | POA: Diagnosis not present

## 2021-08-01 DIAGNOSIS — Z794 Long term (current) use of insulin: Secondary | ICD-10-CM | POA: Insufficient documentation

## 2021-08-01 DIAGNOSIS — Z20822 Contact with and (suspected) exposure to covid-19: Secondary | ICD-10-CM | POA: Diagnosis not present

## 2021-08-01 DIAGNOSIS — R5383 Other fatigue: Secondary | ICD-10-CM | POA: Insufficient documentation

## 2021-08-01 DIAGNOSIS — Z7984 Long term (current) use of oral hypoglycemic drugs: Secondary | ICD-10-CM | POA: Insufficient documentation

## 2021-08-01 DIAGNOSIS — Z87891 Personal history of nicotine dependence: Secondary | ICD-10-CM | POA: Diagnosis not present

## 2021-08-01 DIAGNOSIS — R300 Dysuria: Secondary | ICD-10-CM | POA: Insufficient documentation

## 2021-08-01 DIAGNOSIS — Z7982 Long term (current) use of aspirin: Secondary | ICD-10-CM | POA: Diagnosis not present

## 2021-08-01 DIAGNOSIS — R519 Headache, unspecified: Secondary | ICD-10-CM | POA: Insufficient documentation

## 2021-08-01 LAB — CBC
HCT: 35.7 % — ABNORMAL LOW (ref 39.0–52.0)
Hemoglobin: 12.1 g/dL — ABNORMAL LOW (ref 13.0–17.0)
MCH: 29.7 pg (ref 26.0–34.0)
MCHC: 33.9 g/dL (ref 30.0–36.0)
MCV: 87.7 fL (ref 80.0–100.0)
Platelets: 155 10*3/uL (ref 150–400)
RBC: 4.07 MIL/uL — ABNORMAL LOW (ref 4.22–5.81)
RDW: 13.5 % (ref 11.5–15.5)
WBC: 7.2 10*3/uL (ref 4.0–10.5)
nRBC: 0 % (ref 0.0–0.2)

## 2021-08-01 LAB — URINALYSIS, MICROSCOPIC (REFLEX): WBC, UA: NONE SEEN WBC/hpf (ref 0–5)

## 2021-08-01 LAB — URINALYSIS, ROUTINE W REFLEX MICROSCOPIC
Bilirubin Urine: NEGATIVE
Glucose, UA: NEGATIVE mg/dL
Hgb urine dipstick: NEGATIVE
Ketones, ur: NEGATIVE mg/dL
Leukocytes,Ua: NEGATIVE
Nitrite: NEGATIVE
Protein, ur: 100 mg/dL — AB
Specific Gravity, Urine: 1.015 (ref 1.005–1.030)
pH: 7 (ref 5.0–8.0)

## 2021-08-01 LAB — BASIC METABOLIC PANEL
Anion gap: 8 (ref 5–15)
BUN: 12 mg/dL (ref 8–23)
CO2: 26 mmol/L (ref 22–32)
Calcium: 9.1 mg/dL (ref 8.9–10.3)
Chloride: 101 mmol/L (ref 98–111)
Creatinine, Ser: 1.17 mg/dL (ref 0.61–1.24)
GFR, Estimated: >60 mL/min (ref >60)
Glucose, Bld: 212 mg/dL — ABNORMAL HIGH (ref 70–99)
Potassium: 3.6 mmol/L (ref 3.5–5.1)
Sodium: 135 mmol/L (ref 135–145)

## 2021-08-01 LAB — RESP PANEL BY RT-PCR (FLU A&B, COVID) ARPGX2
Influenza A by PCR: NEGATIVE
Influenza B by PCR: NEGATIVE
SARS Coronavirus 2 by RT PCR: NEGATIVE

## 2021-08-01 MED ORDER — LACTATED RINGERS IV BOLUS
1000.0000 mL | Freq: Once | INTRAVENOUS | Status: AC
  Administered 2021-08-01: 1000 mL via INTRAVENOUS

## 2021-08-01 MED ORDER — MECLIZINE HCL 25 MG PO TABS: 25.0000 mg | ORAL_TABLET | Freq: Three times a day (TID) | ORAL | 0 refills | Status: DC | PRN

## 2021-08-01 NOTE — ED Notes (Signed)
Pt sts he is feeling better now ?

## 2021-08-01 NOTE — ED Provider Notes (Signed)
?[place] EMERGENCY DEPARTMENT ?Provider Note ? ?CSN: [id number] ?Arrival date & time: 08/01/21 1739 ? ?Chief Complaint(s) ?Dizziness ? ?HPI ?Aaron Blevins is a 76 y.o. male with PMH right knee arthritis, T2DM, HTN, previous CVA and 2013 with no residual deficits who presents the emergency department for evaluation of dizziness and fatigue.  He states that he has been to multiple funerals recently and has not been eating or drinking well.  States that his symptoms have been present for approximately 1 month but worsening over the last 1 week.  He also endorses mild dysuria.  Denies chest pain, shortness of breath, or other systemic symptoms.  Endorses mild bilateral headache in a bandlike distribution. ? ? ?Dizziness ?Associated symptoms: headaches   ? ?Past Medical History ?Past Medical History:  ?Diagnosis Date  ? Arthritis   ? RIGHT KNEE  ? Diabetes mellitus   ? GERD (gastroesophageal reflux disease)   ? High cholesterol   ? Hypertension   ? Stroke [facility])   ? JUNE 2013--HAD TERRIBLE H/A--NO DEFICITS  ? Trouble swallowing   ? ?Patient Active Problem List  ? Diagnosis Date Noted  ? Hyperlipidemia 09/08/2017  ? 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 Stroke ([place]) 10/19/2011  ? DM (diabetes mellitus) ([place]) 10/19/2011  ? HTN (hypertension) 10/19/2011  ? GERD (gastroesophageal reflux disease) 10/19/2011  ? ?Home Medication(s) ?Prior to Admission medications   ?Medication Sig Start Date End Date Taking? Authorizing Provider  ?aspirin 81 MG tablet Take 81 mg by mouth daily.    [provider]  ?Blood Glucose Monitoring Suppl (ONE TOUCH ULTRA 2) w/Device KIT USE TWICE DAILY TO TEST BLOOD SUGAR 07/13/18   [provider]  ?diclofenac Sodium (VOLTAREN) 1 % GEL Apply 2 g topically 4 (four) times daily as needed. 01/21/21   Long, Wonda Olds, MD  ?docusate sodium (COLACE) 250 MG capsule Take 1 capsule (250 mg total) by mouth daily as needed for constipation. 01/21/21   Long, Wonda Olds, MD  ?famotidine (PEPCID) 40  MG tablet Take 40 mg by mouth daily. ?Patient not taking: Reported on 01/24/2020 09/13/18   [provider]  ?fluticasone (FLONASE) 50 MCG/ACT nasal spray Place 1 spray into both nostrils daily. ?Patient not taking: Reported on 01/24/2020    [provider]  ?insulin glargine (LANTUS) 100 UNIT/ML injection Inject 20 Units into the skin at bedtime.     [provider]  ?ketoconazole (NIZORAL) 2 % cream Apply 1 application topically daily. ?Patient not taking: Reported on 01/24/2020    [provider]  ?latanoprost (XALATAN) 0.005 % ophthalmic solution Place 1 drop into both eyes at bedtime. ?Patient not taking: Reported on 01/24/2020    [provider]  ?losartan (COZAAR) 25 MG tablet Take 1 tablet by mouth daily. 12/14/18   [provider]  ?meclizine (ANTIVERT) 25 MG tablet Take 25 mg by mouth 3 (three) times daily as needed for dizziness.    [provider]  ?metFORMIN (GLUCOPHAGE) 1000 MG tablet Take 1,000 mg by mouth 2 (two) times daily with a meal. 06/23/18   [provider]  ?Multiple Vitamins-Minerals (CENTRUM SILVER PO) Take 1 tablet by mouth daily.    [provider]  ?nystatin cream (MYCOSTATIN) APPLY CREAM TOPICALLY TO AFFECTED AREA TWICE DAILY (When needed) ?Patient not taking: Reported on 01/24/2020 03/20/18   [provider]  ?pravastatin (PRAVACHOL) 20 MG tablet Take 20 mg by mouth daily. 09/13/18   [provider]  ?silodosin (RAPAFLO) 8 MG CAPS capsule TAKE 1  CAPSULE BY MOUTH ONCE DAILY WITH BREAKFAST 10/10/18   [provider]  ?triamcinolone cream (KENALOG) 0.1 % APPLY A THIN LAYER TOPICALLY TO AFFECTED AREA TWICE DAILY ?Patient not taking: Reported on 01/24/2020 03/20/18   [provider]  ?                                                                                                                                  ?Past Surgical History ?Past Surgical History:  ?Procedure Laterality Date  ?  BALLOON DILATION  04/12/2012  ? Procedure: BALLOON DILATION;  Surgeon: Arta Silence, MD;  [facility]: [facility];  Service: Endoscopy;  Laterality: N/A;  ? CIRCUMCISION    ? ESOPHAGOGASTRODUODENOSCOPY (EGD) WITH PROPOFOL  04/12/2012  ? Procedure: ESOPHAGOGASTRODUODENOSCOPY (EGD) WITH PROPOFOL;  Surgeon: Arta Silence, MD;  Location: [facility];  Service: Endoscopy;  Laterality: N/A;  ? ?Family History ?No family history on file. ? ?Social History ?Social History  ? ?Tobacco Use  ? Smoking status: Former  ? Smokeless tobacco: Never  ?Vaping Use  ? Vaping Use: Never used  ?Substance Use Topics  ? Alcohol use: No  ? Drug use: No  ? ?Allergies ?Patient has no known allergies. ? ?Review of Systems ?Review of Systems  ?Genitourinary:  Positive for dysuria.  ?Neurological:  Positive for dizziness and headaches.  ? ?Physical Exam ?Vital Signs  ?I have reviewed the triage vital signs ?BP (!) 168/88 (BP Location: Right Arm)   Pulse 76   Temp 99.6 ?F (37.6 ?C) (Oral)   Resp (!) 22   Ht 5' 7"  (1.702 m)   Wt 77.1 kg   SpO2 95%   BMI 26.63 kg/m?  ? ?Physical Exam ?Vitals and nursing note reviewed.  ?Constitutional:   ?   General: He is not in acute distress. ?   Appearance: He is well-developed.  ?HENT:  ?   Head: Normocephalic and atraumatic.  ?Eyes:  ?   Conjunctiva/sclera: Conjunctivae normal.  ?Cardiovascular:  ?   Rate and Rhythm: Normal rate and regular rhythm.  ?   Heart sounds: No murmur heard. ?Pulmonary:  ?   Effort: Pulmonary effort is normal. No respiratory distress.  ?   Breath sounds: Normal breath sounds.  ?Abdominal:  ?   Palpations: Abdomen is soft.  ?   Tenderness: There is no abdominal tenderness.  ?Musculoskeletal:     ?   General: No swelling.  ?   Cervical back: Neck supple.  ?Skin: ?   General: Skin is warm and dry.  ?   Capillary Refill: Capillary refill takes less than 2 seconds.  ?Neurological:  ?   Mental Status: He is alert.  ?Psychiatric:     ?   Mood and Affect: Mood normal.  ? ? ?ED  Results and Treatments ?Labs ?(all labs ordered are listed, but only abnormal results are displayed) ?Labs Reviewed  ?RESP PANEL BY RT-PCR (FLU A&B, COVID) [id number]  ?BASIC METABOLIC  PANEL  ?CBC  ?URINALYSIS, ROUTINE W REFLEX MICROSCOPIC  ?                                                                                                                       ? ?Radiology ?No results found. ? ?Pertinent labs & imaging results that were available during my care of the patient were reviewed by me and considered in my medical decision making (see MDM for details). ? ?Medications Ordered in ED ?Medications - No data to display                                                               ?                                                                    ?Procedures ?Procedures ? ?(including critical care time) ? ?Medical Decision Making / ED Course ? ? ?This patient presents to the ED for concern of dizziness, this involves an extensive number of treatment options, and is a complaint that carries with it a high risk of complications and morbidity.  The differential diagnosis includes hypovolemia, orthostatic presyncope, UTI, COVID-19, influenza, electrolyte abnormality, peripheral vertigo ? ?MDM: ?Seen the emergency department for evaluation of dizziness and lightheadedness.  Physical exam is unremarkable.  Laboratory evaluation largely unremarkable, mild anemia to 12.1 but with normal MCV.  Urinalysis unremarkable.  COVID and flu negative.  Patient given 1 L lactated Ringer's and on reevaluation states that his lightheadedness has resolved and he is able to ambulate without difficulty.  CT head unremarkable.  Of note, patient does have sinus bradycardia while sleeping in the upper 40s.  He has had no chest pain or shortness of breath and this can be evaluated outpatient.  Patient then discharged with a as needed prescription for meclizine as he does have a history of peripheral vertigo. ? ? ?Additional history  obtained: ?-Additional history obtained from wife ?-External records from outside source obtained and reviewed including: Chart review including previous notes, labs, imaging, consultation notes ? ? ?Lab Tests: ?-I ordered, rev

## 2021-08-01 NOTE — ED Notes (Signed)
Ambulated pt in hall; pt gait is unsteady; he sts he doesn't feel much different then when he came in; he normally walks without assistance  ?

## 2021-08-01 NOTE — ED Triage Notes (Signed)
Pt c/o dizziness and headache for several days.  ?

## 2021-08-01 NOTE — ED Notes (Signed)
EDP made aware of BP; no orders received; pt instructed to resume BP meds in the morning (did not take them this morning) ?

## 2021-10-13 ENCOUNTER — Encounter (HOSPITAL_BASED_OUTPATIENT_CLINIC_OR_DEPARTMENT_OTHER): Payer: Self-pay | Admitting: Emergency Medicine

## 2021-10-13 ENCOUNTER — Emergency Department (HOSPITAL_BASED_OUTPATIENT_CLINIC_OR_DEPARTMENT_OTHER): Payer: HMO

## 2021-10-13 ENCOUNTER — Other Ambulatory Visit: Payer: Self-pay

## 2021-10-13 ENCOUNTER — Emergency Department (HOSPITAL_BASED_OUTPATIENT_CLINIC_OR_DEPARTMENT_OTHER)
Admission: EM | Admit: 2021-10-13 | Discharge: 2021-10-13 | Disposition: A | Discharge status: Home / Self Care | Payer: HMO | Attending: Emergency Medicine | Admitting: Emergency Medicine

## 2021-10-13 DIAGNOSIS — Z794 Long term (current) use of insulin: Secondary | ICD-10-CM | POA: Diagnosis not present

## 2021-10-13 DIAGNOSIS — R000 Tachycardia, unspecified: Secondary | ICD-10-CM | POA: Insufficient documentation

## 2021-10-13 DIAGNOSIS — Z7984 Long term (current) use of oral hypoglycemic drugs: Secondary | ICD-10-CM | POA: Diagnosis not present

## 2021-10-13 DIAGNOSIS — Z7982 Long term (current) use of aspirin: Secondary | ICD-10-CM | POA: Diagnosis not present

## 2021-10-13 DIAGNOSIS — I10 Essential (primary) hypertension: Secondary | ICD-10-CM | POA: Diagnosis not present

## 2021-10-13 DIAGNOSIS — Z79899 Other long term (current) drug therapy: Secondary | ICD-10-CM | POA: Diagnosis not present

## 2021-10-13 DIAGNOSIS — E119 Type 2 diabetes mellitus without complications: Secondary | ICD-10-CM | POA: Diagnosis not present

## 2021-10-13 DIAGNOSIS — R0789 Other chest pain: Secondary | ICD-10-CM | POA: Diagnosis not present

## 2021-10-13 DIAGNOSIS — U071 COVID-19: Secondary | ICD-10-CM | POA: Insufficient documentation

## 2021-10-13 DIAGNOSIS — R5383 Other fatigue: Secondary | ICD-10-CM | POA: Diagnosis present

## 2021-10-13 LAB — URINALYSIS, MICROSCOPIC (REFLEX)

## 2021-10-13 LAB — COMPREHENSIVE METABOLIC PANEL
ALT: 14 U/L (ref 0–44)
AST: 21 U/L (ref 15–41)
Albumin: 3.7 g/dL (ref 3.5–5.0)
Alkaline Phosphatase: 69 U/L (ref 38–126)
Anion gap: 11 (ref 5–15)
BUN: 18 mg/dL (ref 8–23)
CO2: 29 mmol/L (ref 22–32)
Calcium: 9.3 mg/dL (ref 8.9–10.3)
Chloride: 99 mmol/L (ref 98–111)
Creatinine, Ser: 1.34 mg/dL — ABNORMAL HIGH (ref 0.61–1.24)
GFR, Estimated: 55 mL/min — ABNORMAL LOW (ref >60)
Glucose, Bld: 143 mg/dL — ABNORMAL HIGH (ref 70–99)
Potassium: 4 mmol/L (ref 3.5–5.1)
Sodium: 139 mmol/L (ref 135–145)
Total Bilirubin: 0.8 mg/dL (ref 0.3–1.2)
Total Protein: 7.7 g/dL (ref 6.5–8.1)

## 2021-10-13 LAB — CBC WITH DIFFERENTIAL/PLATELET
Abs Immature Granulocytes: 0.02 10*3/uL (ref 0.00–0.07)
Basophils Absolute: 0 10*3/uL (ref 0.0–0.1)
Basophils Relative: 1 %
Eosinophils Absolute: 0.1 10*3/uL (ref 0.0–0.5)
Eosinophils Relative: 2 %
HCT: 40.7 % (ref 39.0–52.0)
Hemoglobin: 13.8 g/dL (ref 13.0–17.0)
Immature Granulocytes: 0 %
Lymphocytes Relative: 20 %
Lymphs Abs: 1.3 10*3/uL (ref 0.7–4.0)
MCH: 29.9 pg (ref 26.0–34.0)
MCHC: 33.9 g/dL (ref 30.0–36.0)
MCV: 88.1 fL (ref 80.0–100.0)
Monocytes Absolute: 1.3 10*3/uL — ABNORMAL HIGH (ref 0.1–1.0)
Monocytes Relative: 20 %
Neutro Abs: 3.5 10*3/uL (ref 1.7–7.7)
Neutrophils Relative %: 57 %
Platelets: 144 10*3/uL — ABNORMAL LOW (ref 150–400)
RBC: 4.62 MIL/uL (ref 4.22–5.81)
RDW: 13.3 % (ref 11.5–15.5)
WBC: 6.2 10*3/uL (ref 4.0–10.5)
nRBC: 0 % (ref 0.0–0.2)

## 2021-10-13 LAB — URINALYSIS, ROUTINE W REFLEX MICROSCOPIC
Bilirubin Urine: NEGATIVE
Glucose, UA: NEGATIVE mg/dL
Ketones, ur: NEGATIVE mg/dL
Leukocytes,Ua: NEGATIVE
Nitrite: NEGATIVE
Protein, ur: >=300 mg/dL — AB
Specific Gravity, Urine: 1.015 (ref 1.005–1.030)
pH: 7 (ref 5.0–8.0)

## 2021-10-13 LAB — LIPASE, BLOOD: Lipase: 34 U/L (ref 11–51)

## 2021-10-13 LAB — SARS CORONAVIRUS 2 BY RT PCR: SARS Coronavirus 2 by RT PCR: POSITIVE — AB

## 2021-10-13 LAB — TROPONIN I (HIGH SENSITIVITY)
Troponin I (High Sensitivity): 18 ng/L — ABNORMAL HIGH (ref <18)
Troponin I (High Sensitivity): 16 ng/L (ref <18)

## 2021-10-13 LAB — LACTIC ACID, PLASMA
Lactic Acid, Venous: 0.9 mmol/L (ref 0.5–1.9)
Lactic Acid, Venous: 1.2 mmol/L (ref 0.5–1.9)

## 2021-10-13 MED ORDER — ONDANSETRON HCL 4 MG PO TABS: 4.0000 mg | ORAL_TABLET | Freq: Three times a day (TID) | ORAL | 0 refills | Status: AC | PRN

## 2021-10-13 MED ORDER — ONDANSETRON HCL 4 MG/2ML IJ SOLN
4.0000 mg | Freq: Once | INTRAMUSCULAR | Status: AC
  Administered 2021-10-13: 4 mg via INTRAVENOUS
  Filled 2021-10-13: qty 2

## 2021-10-13 MED ORDER — SODIUM CHLORIDE 0.9 % IV BOLUS
500.0000 mL | Freq: Once | INTRAVENOUS | Status: AC
  Administered 2021-10-13: 500 mL via INTRAVENOUS

## 2021-10-13 MED ORDER — MOLNUPIRAVIR EUA 200MG CAPSULE: 4.0000 | ORAL_CAPSULE | Freq: Two times a day (BID) | ORAL | 0 refills | Status: AC

## 2021-10-13 MED ORDER — BENZONATATE 100 MG PO CAPS: 100.0000 mg | ORAL_CAPSULE | Freq: Three times a day (TID) | ORAL | 0 refills | Status: AC

## 2021-10-13 MED ORDER — ACETAMINOPHEN 325 MG PO TABS
650.0000 mg | ORAL_TABLET | Freq: Once | ORAL | Status: AC
  Administered 2021-10-13: 650 mg via ORAL
  Filled 2021-10-13: qty 2

## 2021-10-13 NOTE — Discharge Instructions (Signed)
Your history, exam and work-up today are consistent with COVID-19 causing her symptoms.  The x-ray did not show pneumonia and your vital signs were reassuring after 4 hours of monitoring.  You did not have any hypoxia.  Your labs were also reassuring.  Given the coughing, please take the Tessalon and for the nausea please use the nausea medicine.  Based on your age and comorbidities we agreed to give you prescription for the COVID medication to prevent worsening or needing admission.  However if any symptoms change or worsen acutely, please consider returning for reassessment.

## 2021-10-13 NOTE — ED Provider Notes (Signed)
[facility] EMERGENCY DEPARTMENT Provider Note   CSN: [id number] Arrival date & time: 10/13/21  1904     History  Chief Complaint  Patient presents with   Headache    Aaron Blevins is a 76 y.o. male.  The history is provided by the patient, a relative and medical records. No language interpreter was used.  Headache Pain location:  Generalized Quality:  Dull Radiates to:  Does not radiate Onset quality:  Gradual Timing:  Constant Progression:  Improving Chronicity:  New Relieved by:  Nothing Worsened by:  Nothing Ineffective treatments:  None tried Associated symptoms: congestion, cough and fatigue   Associated symptoms: no abdominal pain, no back pain, no blurred vision, no diarrhea, no dizziness, no fever (subjective), no myalgias, no nausea, no neck stiffness, no numbness, no paresthesias, no photophobia, no visual change, no vomiting and no weakness       Home Medications Prior to Admission medications   Medication Sig Start Date End Date Taking? Authorizing Provider  aspirin 81 MG tablet Take 81 mg by mouth daily.    [provider]  Blood Glucose Monitoring Suppl (ONE TOUCH ULTRA 2) w/Device KIT USE TWICE DAILY TO TEST BLOOD SUGAR 07/13/18   [provider]  diclofenac Sodium (VOLTAREN) 1 % GEL Apply 2 g topically 4 (four) times daily as needed. 01/21/21   Long, Wonda Olds, MD  docusate sodium (COLACE) 250 MG capsule Take 1 capsule (250 mg total) by mouth daily as needed for constipation. 01/21/21   Long, Wonda Olds, MD  famotidine (PEPCID) 40 MG tablet Take 40 mg by mouth daily. Patient not taking: Reported on 01/24/2020 09/13/18   [provider]  fluticasone (FLONASE) 50 MCG/ACT nasal spray Place 1 spray into both nostrils daily. Patient not taking: Reported on 01/24/2020    [provider]  insulin glargine (LANTUS) 100 UNIT/ML injection Inject 20 Units into the skin at bedtime.     [provider]  ketoconazole  (NIZORAL) 2 % cream Apply 1 application topically daily. Patient not taking: Reported on 01/24/2020    [provider]  latanoprost (XALATAN) 0.005 % ophthalmic solution Place 1 drop into both eyes at bedtime. Patient not taking: Reported on 01/24/2020    [provider]  losartan (COZAAR) 25 MG tablet Take 1 tablet by mouth daily. 12/14/18   [provider]  meclizine (ANTIVERT) 25 MG tablet Take 1 tablet (25 mg total) by mouth 3 (three) times daily as needed for dizziness. 08/01/21   Kommor, Madison, MD  metFORMIN (GLUCOPHAGE) 1000 MG tablet Take 1,000 mg by mouth 2 (two) times daily with a meal. 06/23/18   [provider]  Multiple Vitamins-Minerals (CENTRUM SILVER PO) Take 1 tablet by mouth daily.    [provider]  nystatin cream (MYCOSTATIN) APPLY CREAM TOPICALLY TO AFFECTED AREA TWICE DAILY (When needed) Patient not taking: Reported on 01/24/2020 03/20/18   [provider]  pravastatin (PRAVACHOL) 20 MG tablet Take 20 mg by mouth daily. 09/13/18   [provider]  silodosin (RAPAFLO) 8 MG CAPS capsule TAKE 1 CAPSULE BY MOUTH ONCE DAILY WITH BREAKFAST 10/10/18   [provider]  triamcinolone cream (KENALOG) 0.1 % APPLY A THIN LAYER TOPICALLY TO AFFECTED AREA TWICE DAILY Patient not taking: Reported on 01/24/2020 03/20/18   [provider]      Allergies    Patient has no known allergies.    Review of Systems   Review of Systems  Constitutional:  Positive for  chills and fatigue. Negative for fever (subjective).  HENT:  Positive for congestion.   Eyes:  Negative for blurred vision and photophobia.  Respiratory:  Positive for cough. Negative for chest tightness, shortness of breath and wheezing.   Cardiovascular:  Negative for chest pain and palpitations.  Gastrointestinal:  Negative for abdominal pain, constipation, diarrhea, nausea and vomiting.  Genitourinary:  Negative for dysuria, flank pain and frequency.   Musculoskeletal:  Negative for back pain, myalgias and neck stiffness.  Skin:  Negative for rash and wound.  Neurological:  Positive for headaches. Negative for dizziness, weakness, light-headedness, numbness and paresthesias.  Psychiatric/Behavioral:  Negative for agitation and confusion.   All other systems reviewed and are negative.  Physical Exam Updated Vital Signs BP 130/83 (BP Location: Right Arm)   Pulse 77   Temp 100.1 F (37.8 C) (Oral)   Resp 20   Ht 5' 7"  (1.702 m)   Wt 79.4 kg   SpO2 97%   BMI 27.41 kg/m  Physical Exam Vitals and nursing note reviewed.  Constitutional:      General: He is not in acute distress.    Appearance: He is well-developed. He is not ill-appearing, toxic-appearing or diaphoretic.  HENT:     Head: Normocephalic and atraumatic.     Nose: Congestion and rhinorrhea present.     Mouth/Throat:     Mouth: Mucous membranes are moist.  Eyes:     Extraocular Movements: Extraocular movements intact.     Conjunctiva/sclera: Conjunctivae normal.     Pupils: Pupils are equal, round, and reactive to light.  Cardiovascular:     Rate and Rhythm: Normal rate and regular rhythm.     Heart sounds: No murmur heard. Pulmonary:     Effort: Pulmonary effort is normal. No respiratory distress.     Breath sounds: Rhonchi present. No wheezing or rales.  Chest:     Chest wall: No tenderness.  Abdominal:     Palpations: Abdomen is soft.     Tenderness: There is no abdominal tenderness. There is no right CVA tenderness, left CVA tenderness, guarding or rebound.  Musculoskeletal:        General: No swelling or tenderness.     Cervical back: Neck supple. No tenderness.     Right lower leg: No edema.     Left lower leg: No edema.  Skin:    General: Skin is warm and dry.     Capillary Refill: Capillary refill takes less than 2 seconds.     Coloration: Skin is not pale.     Findings: No erythema or rash.  Neurological:     General: No focal deficit present.      Mental Status: He is alert.     Sensory: No sensory deficit.     Motor: No weakness.  Psychiatric:        Mood and Affect: Mood normal.    ED Results / Procedures / Treatments   Labs (all labs ordered are listed, but only abnormal results are displayed) Labs Reviewed  SARS CORONAVIRUS 2 BY RT PCR - Abnormal; Notable for the following components:      Result Value   SARS Coronavirus 2 by RT PCR POSITIVE (*)    All other components within normal limits  CBC WITH DIFFERENTIAL/PLATELET - Abnormal; Notable for the following components:   Platelets 144 (*)    Monocytes Absolute 1.3 (*)    All other components within normal limits  COMPREHENSIVE METABOLIC PANEL - Abnormal; Notable for the  following components:   Glucose, Bld 143 (*)    Creatinine, Ser 1.34 (*)    GFR, Estimated 55 (*)    All other components within normal limits  URINALYSIS, ROUTINE W REFLEX MICROSCOPIC - Abnormal; Notable for the following components:   Hgb urine dipstick SMALL (*)    Protein, ur >=300 (*)    All other components within normal limits  URINALYSIS, MICROSCOPIC (REFLEX) - Abnormal; Notable for the following components:   Bacteria, UA RARE (*)    All other components within normal limits  TROPONIN I (HIGH SENSITIVITY) - Abnormal; Notable for the following components:   Troponin I (High Sensitivity) 18 (*)    All other components within normal limits  URINE CULTURE  CULTURE, BLOOD (ROUTINE X 2)  CULTURE, BLOOD (ROUTINE X 2)  LACTIC ACID, PLASMA  LACTIC ACID, PLASMA  LIPASE, BLOOD  TROPONIN I (HIGH SENSITIVITY)    EKG EKG Interpretation  Date/Time:  Tuesday Oct 13 2021 21:22:01 EDT Ventricular Rate:  78 PR Interval:  166 QRS Duration: 94 QT Interval:  379 QTC Calculation: 432 R Axis:   57 Text Interpretation: Sinus rhythm Atrial premature complex Abnormal R-wave progression, late transition when compared to prior, similar appearance. No STEMI Confirmed by Antony Blackbird [phone]) on  10/13/2021 10:13:17 PM  Radiology DG Chest Portable 1 View  Result Date: 10/13/2021 CLINICAL DATA:  COVID positive, tachycardia, tachypnea, productive cough EXAM: PORTABLE CHEST 1 VIEW COMPARISON:  03/31/2019 FINDINGS: Single frontal view of the chest demonstrates an unremarkable cardiac silhouette. No acute airspace disease, effusion, or pneumothorax. No acute bony abnormalities. IMPRESSION: 1. No acute intrathoracic process. Electronically Signed   By: Randa Ngo M.D.   On: 10/13/2021 20:48    Procedures Procedures    Medications Ordered in ED Medications  sodium chloride 0.9 % bolus 500 mL (0 mLs Intravenous Stopped 10/13/21 2130)  ondansetron (ZOFRAN) injection 4 mg (4 mg Intravenous Given 10/13/21 2050)  acetaminophen (TYLENOL) tablet 650 mg (650 mg Oral Given 10/13/21 2324)    ED Course/ Medical Decision Making/ A&P                           Medical Decision Making Amount and/or Complexity of Data Reviewed Labs: ordered. Radiology: ordered.  Risk OTC drugs. Prescription drug management.    Aaron Blevins is a 76 y.o. male with a past medical history significant for diabetes, hypertension, GERD, hyperlipidemia, previous stroke who presents with fevers, chills, productive cough, chest pain, shortness of breath, nausea, fatigue, malaise, headache and lightheadedness.  According to patient his symptoms all began yesterday.  This is all new.  He reports nasal congestion and sore throat as well.  He denies any sick contacts.  He reports the chest tightness is more with coughing and not pleuritic.  He reports no abdominal pain but does report the nausea.  No vomiting.  He denies constipation or diarrhea.  Denies leg pain or leg swelling.  Reports the malaise and fatigue all over.  Denies any neck pain or neck stiffness.  Reports some moderate headache.  On my exam, lungs had coarseness bilaterally.  Patient is tachycardic with rates into the 140s and 150s.  Chest was nontender.  No  murmur.  Abdomen nontender.  Normal bowel sounds.  Patient is extremely warm to the touch.  Normal neck range of motion.  No carotid bruit appreciated on my exam.  Legs nontender and nonedematous.  Patient is ill-appearing and tachypneic.  Patient  had a COVID swab obtained while in triage and was found to be positive.  I suspect patient has COVID-19 however with his ill appearance and symptoms, we will get labs, chest x-ray, EKG with his reported tachycardia, and give him a small amount of fluids.  We will reassess after work-up however I anticipate patient may need admission for his COVID infection.   Patient's rest of work-up was overall reassuring.  His x-ray does not show pneumonia.  His labs were overall reassuring which I went through with the family.  Due to his age and COVID infection, we did offer admission but they did not want this.  They would want to take him home.  Due to his age and companies we will give prescription for Monday.  We are and cough medicine and nausea medicine.  He will rest and stay hydrated and isolate.  He understands to return if any symptoms change or worsen for reassessment and possible admission at that time.  They no other questions or concerns and patient discharged in good condition.         Final Clinical Impression(s) / ED Diagnoses Final diagnoses:  COVID-19    Rx / DC Orders ED Discharge Orders          Ordered    molnupiravir EUA (LAGEVRIO) 200 mg CAPS capsule  2 times daily        10/13/21 2308    ondansetron (ZOFRAN) 4 MG tablet  Every 8 hours PRN        10/13/21 2308    benzonatate (TESSALON) 100 MG capsule  Every 8 hours        10/13/21 2308           Clinical Impression: 1. COVID-19     Disposition: Discharge  Condition: Good  I have discussed the results, Dx and Tx plan with the pt(& family if present). He/she/they expressed understanding and agree(s) with the plan. Discharge instructions discussed at great length.  Strict return precautions discussed and pt &/or family have verbalized understanding of the instructions. No further questions at time of discharge.    Discharge Medication List as of 10/13/2021 11:09 PM     START taking these medications   Details  benzonatate (TESSALON) 100 MG capsule Take 1 capsule (100 mg total) by mouth every 8 (eight) hours., Starting Tue 10/13/2021, Print    molnupiravir EUA (LAGEVRIO) 200 mg CAPS capsule Take 4 capsules (800 mg total) by mouth 2 (two) times daily for 5 days., Starting Tue 10/13/2021, Until Sun 10/18/2021, Print    ondansetron (ZOFRAN) 4 MG tablet Take 1 tablet (4 mg total) by mouth every 8 (eight) hours as needed for nausea or vomiting., Starting Tue 10/13/2021, Print        Follow Up: Beckie Salts, MD     [facility] EM[facility]PART[facility] [address] [id number] [facility] [place]       Herbie Lehrmann, Gwenyth Allegra, MD 10/13/21 2350

## 2021-10-13 NOTE — ED Triage Notes (Signed)
Patient c/o headache, shortness of breath, nasal drainage, dizziness, and cough that started today.

## 2021-10-13 NOTE — ED Notes (Signed)
EDP at bedside  

## 2021-10-14 LAB — URINE CULTURE: Culture: NO GROWTH

## 2021-10-18 LAB — CULTURE, BLOOD (ROUTINE X 2)
Culture: NO GROWTH
Culture: NO GROWTH
Special Requests: ADEQUATE

## 2021-11-19 ENCOUNTER — Other Ambulatory Visit: Payer: Self-pay

## 2021-11-19 ENCOUNTER — Encounter (HOSPITAL_BASED_OUTPATIENT_CLINIC_OR_DEPARTMENT_OTHER): Payer: Self-pay | Admitting: Emergency Medicine

## 2021-11-19 ENCOUNTER — Emergency Department (HOSPITAL_BASED_OUTPATIENT_CLINIC_OR_DEPARTMENT_OTHER)
Admission: EM | Admit: 2021-11-19 | Discharge: 2021-11-19 | Disposition: A | Discharge status: Home / Self Care | Payer: HMO | Attending: Emergency Medicine | Admitting: Emergency Medicine

## 2021-11-19 ENCOUNTER — Emergency Department (HOSPITAL_COMMUNITY): Payer: HMO

## 2021-11-19 ENCOUNTER — Emergency Department (HOSPITAL_BASED_OUTPATIENT_CLINIC_OR_DEPARTMENT_OTHER): Payer: HMO

## 2021-11-19 DIAGNOSIS — R2681 Unsteadiness on feet: Secondary | ICD-10-CM | POA: Diagnosis not present

## 2021-11-19 DIAGNOSIS — Z7984 Long term (current) use of oral hypoglycemic drugs: Secondary | ICD-10-CM | POA: Insufficient documentation

## 2021-11-19 DIAGNOSIS — R209 Unspecified disturbances of skin sensation: Secondary | ICD-10-CM | POA: Diagnosis not present

## 2021-11-19 DIAGNOSIS — Z7982 Long term (current) use of aspirin: Secondary | ICD-10-CM | POA: Insufficient documentation

## 2021-11-19 DIAGNOSIS — R519 Headache, unspecified: Secondary | ICD-10-CM | POA: Diagnosis not present

## 2021-11-19 DIAGNOSIS — Z794 Long term (current) use of insulin: Secondary | ICD-10-CM | POA: Insufficient documentation

## 2021-11-19 DIAGNOSIS — I639 Cerebral infarction, unspecified: Secondary | ICD-10-CM | POA: Diagnosis not present

## 2021-11-19 DIAGNOSIS — R531 Weakness: Secondary | ICD-10-CM | POA: Diagnosis not present

## 2021-11-19 DIAGNOSIS — E860 Dehydration: Secondary | ICD-10-CM | POA: Insufficient documentation

## 2021-11-19 DIAGNOSIS — R42 Dizziness and giddiness: Secondary | ICD-10-CM | POA: Insufficient documentation

## 2021-11-19 LAB — URINALYSIS, ROUTINE W REFLEX MICROSCOPIC
Bilirubin Urine: NEGATIVE
Glucose, UA: NEGATIVE mg/dL
Hgb urine dipstick: NEGATIVE
Ketones, ur: NEGATIVE mg/dL
Leukocytes,Ua: NEGATIVE
Nitrite: NEGATIVE
Protein, ur: 100 mg/dL — AB
Specific Gravity, Urine: 1.01 (ref 1.005–1.030)
pH: 7 (ref 5.0–8.0)

## 2021-11-19 LAB — BASIC METABOLIC PANEL
Anion gap: 5 (ref 5–15)
BUN: 14 mg/dL (ref 8–23)
CO2: 27 mmol/L (ref 22–32)
Calcium: 9.1 mg/dL (ref 8.9–10.3)
Chloride: 104 mmol/L (ref 98–111)
Creatinine, Ser: 1.05 mg/dL (ref 0.61–1.24)
GFR, Estimated: >60 mL/min (ref >60)
Glucose, Bld: 169 mg/dL — ABNORMAL HIGH (ref 70–99)
Potassium: 4.1 mmol/L (ref 3.5–5.1)
Sodium: 136 mmol/L (ref 135–145)

## 2021-11-19 LAB — CBC
HCT: 36.2 % — ABNORMAL LOW (ref 39.0–52.0)
Hemoglobin: 12.4 g/dL — ABNORMAL LOW (ref 13.0–17.0)
MCH: 29.8 pg (ref 26.0–34.0)
MCHC: 34.3 g/dL (ref 30.0–36.0)
MCV: 87 fL (ref 80.0–100.0)
Platelets: 130 10*3/uL — ABNORMAL LOW (ref 150–400)
RBC: 4.16 MIL/uL — ABNORMAL LOW (ref 4.22–5.81)
RDW: 13.6 % (ref 11.5–15.5)
WBC: 7.3 10*3/uL (ref 4.0–10.5)
nRBC: 0 % (ref 0.0–0.2)

## 2021-11-19 LAB — URINALYSIS, MICROSCOPIC (REFLEX)
Bacteria, UA: NONE SEEN
Squamous Epithelial / HPF: NONE SEEN (ref 0–5)
WBC, UA: NONE SEEN WBC/hpf (ref 0–5)

## 2021-11-19 LAB — CBG MONITORING, ED: Glucose-Capillary: 165 mg/dL — ABNORMAL HIGH (ref 70–99)

## 2021-11-19 MED ORDER — MECLIZINE HCL 25 MG PO TABS: 25.0000 mg | ORAL_TABLET | Freq: Three times a day (TID) | ORAL | 0 refills | Status: AC | PRN

## 2021-11-19 MED ORDER — IOHEXOL 350 MG/ML SOLN
75.0000 mL | Freq: Once | INTRAVENOUS | Status: AC | PRN
Start: 2021-11-19 — End: 2021-11-19
  Administered 2021-11-19: 75 mL via INTRAVENOUS

## 2021-11-19 MED ORDER — SODIUM CHLORIDE 0.9 % IV BOLUS
1000.0000 mL | Freq: Once | INTRAVENOUS | Status: AC
  Administered 2021-11-19: 1000 mL via INTRAVENOUS

## 2021-11-19 NOTE — ED Notes (Signed)
Pt to [facility] ED for MRI via private vehicle, IV site to remain in place, pt and family instructed to go straight to ED, no stopping.

## 2021-11-19 NOTE — ED Notes (Signed)
Pt ambulated independently without difficulty.

## 2021-11-19 NOTE — ED Provider Notes (Signed)
Patient presenting to the ED via POV from med [facility].  Previously evaluated by Dr. Charm Barges, see their note for full assessment.    Briefly: Patient is 76 y.o. male with history of diabetes, hypertension, stroke who presents to the ED with concerns for dizziness.      Plan: Plan per previous provider: Pending MRI studies.  If MRI negative for stroke and patient can be discharged home with treatment for meclizine.  If MRI positive for acute stroke then patient will be admitted to the hospital.  Clinical Course as of 11/19/21 2320  Thu Nov 19, 2021  1604 Glucose(!): 169 [MB]  1825 Reviewed results of imaging with patient and family.  He ambulated after fluids and still feels unsteady on his feet.  Will need MRI as otherwise work-up has been fairly unremarkable.  Patient is agreeable to plan for transfer via private vehicle over to [facility].  MRI ordered.  If he is positive for stroke will need admission and if negative continue symptomatic treatment at home with meclizine fluids and rest. [MB]  1834 Patient accepted by Dr. Particia Nearing in transfer to get MRI. [MB]  2319 Pt evaluated and notified of findings from MRI. Discussed discharge treatment plan with patient. Answered all available questions. Pt appears safe for discharge at this time.  [SB]    Clinical Course User Index [MB] Terrilee Files, MD [SB] Zaina Jenkin A, PA-C    Results for orders placed or performed during the hospital encounter of 11/19/21  Basic metabolic panel  Result Value Ref Range   Sodium 136 135 - 145 mmol/L   Potassium 4.1 3.5 - 5.1 mmol/L   Chloride 104 98 - 111 mmol/L   CO2 27 22 - 32 mmol/L   Glucose, Bld 169 (H) 70 - 99 mg/dL   BUN 14 8 - 23 mg/dL   Creatinine, Ser 4.23 0.61 - 1.24 mg/dL   Calcium 9.1 8.9 - 95.3 mg/dL   GFR, Estimated >20 >23 mL/min   Anion gap 5 5 - 15  CBC  Result Value Ref Range   WBC 7.3 4.0 - 10.5 K/uL   RBC 4.16 (L) 4.22 - 5.81 MIL/uL   Hemoglobin 12.4 (L) 13.0 - 17.0 g/dL    HCT 34.3 (L) 56.8 - 52.0 %   MCV 87.0 80.0 - 100.0 fL   MCH 29.8 26.0 - 34.0 pg   MCHC 34.3 30.0 - 36.0 g/dL   RDW 61.6 83.7 - 29.0 %   Platelets 130 (L) 150 - 400 K/uL   nRBC 0.0 0.0 - 0.2 %  Urinalysis, Routine w reflex microscopic  Result Value Ref Range   Color, Urine YELLOW YELLOW   APPearance CLEAR CLEAR   Specific Gravity, Urine 1.010 1.005 - 1.030   pH 7.0 5.0 - 8.0   Glucose, UA NEGATIVE NEGATIVE mg/dL   Hgb urine dipstick NEGATIVE NEGATIVE   Bilirubin Urine NEGATIVE NEGATIVE   Ketones, ur NEGATIVE NEGATIVE mg/dL   Protein, ur 211 (A) NEGATIVE mg/dL   Nitrite NEGATIVE NEGATIVE   Leukocytes,Ua NEGATIVE NEGATIVE  Urinalysis, Microscopic (reflex)  Result Value Ref Range   RBC / HPF 0-5 0 - 5 RBC/hpf   WBC, UA NONE SEEN 0 - 5 WBC/hpf   Bacteria, UA NONE SEEN NONE SEEN   Squamous Epithelial / LPF NONE SEEN 0 - 5  CBG monitoring, ED  Result Value Ref Range   Glucose-Capillary 165 (H) 70 - 99 mg/dL   MR BRAIN WO CONTRAST  Result Date: 11/19/2021  CLINICAL DATA:  Dizziness EXAM: MRI HEAD WITHOUT CONTRAST TECHNIQUE: Multiplanar, multiecho pulse sequences of the brain and surrounding structures were obtained without intravenous contrast. COMPARISON:  01/12/2020 FINDINGS: Brain: No acute infarct, mass effect or extra-axial collection. No acute or chronic hemorrhage. There is multifocal hyperintense T2-weighted signal within the white matter. Generalized volume loss. The midline structures are normal. Vascular: Major flow voids are preserved. Skull and upper cervical spine: Normal calvarium and skull base. Visualized upper cervical spine and soft tissues are normal. Sinuses/Orbits:No paranasal sinus fluid levels or advanced mucosal thickening. No mastoid or middle ear effusion. Normal orbits. IMPRESSION: 1. No acute intracranial abnormality. 2. Findings of chronic small vessel ischemia and volume loss. Electronically Signed   By: Deatra Robinson M.D.   On: 11/19/2021 22:52   CT ANGIO HEAD  NECK W WO CM  Result Date: 11/19/2021 CLINICAL DATA:  Dizziness. EXAM: CT ANGIOGRAPHY HEAD AND NECK TECHNIQUE: Multidetector CT imaging of the head and neck was performed using the standard protocol during bolus administration of intravenous contrast. Multiplanar CT image reconstructions and MIPs were obtained to evaluate the vascular anatomy. Carotid stenosis measurements (when applicable) are obtained utilizing NASCET criteria, using the distal internal carotid diameter as the denominator. RADIATION DOSE REDUCTION: This exam was performed according to the departmental dose-optimization program which includes automated exposure control, adjustment of the mA and/or kV according to patient size and/or use of iterative reconstruction technique. CONTRAST:  24mL OMNIPAQUE IOHEXOL 350 MG/ML SOLN COMPARISON:  Head CT 08/01/2021. Head MRI 01/12/2020. Head MRA 06/07/2015. Chest CT 12/26/2016. FINDINGS: CT HEAD FINDINGS Brain: There is no evidence of an acute infarct, intracranial hemorrhage, mass, midline shift, or extra-axial fluid collection. Mild cerebral atrophy is within normal limits for age. Hypodensities in the cerebral white matter bilaterally are unchanged and nonspecific but compatible with moderate chronic small vessel ischemic disease. There is an unchanged chronic lacunar infarct in the right thalamus. Vascular: Calcified atherosclerosis at the skull base. Skull: No fracture or suspicious osseous lesion. Sinuses: Minimal mucosal thickening in the right maxillary sinus. Clear mastoid air cells. Orbits: Unremarkable. Review of the MIP images confirms the above findings CTA NECK FINDINGS Aortic arch: Normal variant aortic arch branching pattern with common origin of the brachiocephalic and left common carotid arteries. Wide patency of the brachiocephalic and subclavian arteries. Right carotid system: Patent without evidence of stenosis, dissection, or significant atherosclerosis. Left carotid system: Patent  without evidence of stenosis, dissection, or significant atherosclerosis. Vertebral arteries: Patent and codominant without evidence of stenosis, dissection, or significant atherosclerosis although right V1 assessment is limited by venous contrast. Skeleton: Advanced lower cervical disc degeneration and cervical and upper thoracic facet arthrosis. Other neck: No evidence of cervical lymphadenopathy or mass. Upper chest: No apical lung consolidation or mass. Mild subpleural reticulation/fibrosis bilaterally. Mildly enlarged mediastinal lymph nodes including a 1.8 cm short axis lower right paratracheal lymph node which is unchanged from 2018 and considered benign, possibly related to chronic lung disease. Review of the MIP images confirms the above findings CTA HEAD FINDINGS Anterior circulation: The internal carotid arteries are patent from skull base to carotid termini with calcified plaque resulting in mild proximal supraclinoid stenosis on the right. ACAs and MCAs are patent without evidence of a proximal branch occlusion or significant A1 or left M1 stenosis. A severe distal right M1 stenosis is new from 2017. No aneurysm is identified. Posterior circulation: The intracranial vertebral arteries are widely patent to the basilar. Patent PICA, AICA, and SCA origins are seen bilaterally. The  basilar artery is widely patent. There is a small left posterior communicating artery. Both PCAs are patent without evidence of a significant proximal stenosis. No aneurysm is identified. Venous sinuses: Patent. Anatomic variants: None. Review of the MIP images confirms the above findings IMPRESSION: 1. No evidence of acute intracranial abnormality. Moderate chronic small vessel ischemic disease. 2. Intracranial atherosclerosis including a severe distal right M1 stenosis which is new from 2017. 3. Widely patent cervical carotid and vertebral arteries. Electronically Signed   By: Sebastian Ache M.D.   On: 11/19/2021 17:04     MRI  unremarkable without acute findings of stroke today.  Discussed with patient plans for discharge home and sent home with a prescription for meclizine.  Answered all available questions.  Supportive care and strict return precautions discussed with patient and wife at bedside.  Patient appears safe for discharge at this time.  Follow-up as indicated discharge paperwork.   This chart was dictated using voice recognition software, Dragon. Despite the best efforts of this provider to proofread and correct errors, errors may still occur which can change documentation meaning.   Jonette Wassel A, PA-C 11/20/21 [id number]    Marily Memos, MD 11/20/21 [phone]

## 2021-11-19 NOTE — ED Notes (Signed)
Patient verbalizes understanding of d/c instructions. Opportunities for questions and answers were provided. Pt d/c from ED and wheeled to lobby with family.  

## 2021-11-19 NOTE — ED Provider Notes (Signed)
[facility] EMERGENCY DEPARTMENT Provider Note   CSN: [id number] Arrival date & time: 11/19/21  1518     History  Chief Complaint  Patient presents with   Dizziness    Aaron Blevins is a 76 y.o. male.  He is complaining of dizziness lightheadedness that started 4 days ago.  He says there is a element of spinning but also feels lightheaded like he might pass out.  Feels unsteady when he walks like he might fall.  It is associated with a mild headache.  He also feels a little tingly in his hands and the right side of his face and also notices some difficulty raising his left leg.  He states he had a mini stroke about 10 years ago for and this was dizziness.  Similar to these episodes.  He also has a history of vertigo and has been trying meclizine without improvement.  He has been mowing lawns outside in the heat and humidity.  He also wonders if he may be dehydrated.  The history is provided by the patient, the spouse and a relative.  Dizziness Quality:  Lightheadedness and imbalance Severity:  Moderate Onset quality:  Gradual Duration:  4 days Timing:  Intermittent Progression:  Unchanged Chronicity:  Recurrent Context: physical activity   Relieved by:  Nothing Worsened by:  Movement Ineffective treatments:  Medication and fluids Associated symptoms: headaches   Associated symptoms: no blood in stool, no chest pain, no diarrhea, no hearing loss, no nausea, no shortness of breath, no syncope, no vision changes and no vomiting        Home Medications Prior to Admission medications   Medication Sig Start Date End Date Taking? Authorizing Provider  aspirin 81 MG tablet Take 81 mg by mouth daily.    [provider]  benzonatate (TESSALON) 100 MG capsule Take 1 capsule (100 mg total) by mouth every 8 (eight) hours. 10/13/21   Tegeler, Gwenyth Allegra, MD  Blood Glucose Monitoring Suppl (ONE TOUCH ULTRA 2) w/Device KIT USE TWICE DAILY TO TEST BLOOD SUGAR 07/13/18    [provider]  diclofenac Sodium (VOLTAREN) 1 % GEL Apply 2 g topically 4 (four) times daily as needed. 01/21/21   Long, Wonda Olds, MD  docusate sodium (COLACE) 250 MG capsule Take 1 capsule (250 mg total) by mouth daily as needed for constipation. 01/21/21   Long, Wonda Olds, MD  famotidine (PEPCID) 40 MG tablet Take 40 mg by mouth daily. Patient not taking: Reported on 01/24/2020 09/13/18   [provider]  fluticasone (FLONASE) 50 MCG/ACT nasal spray Place 1 spray into both nostrils daily. Patient not taking: Reported on 01/24/2020    [provider]  insulin glargine (LANTUS) 100 UNIT/ML injection Inject 20 Units into the skin at bedtime.     [provider]  ketoconazole (NIZORAL) 2 % cream Apply 1 application topically daily. Patient not taking: Reported on 01/24/2020    [provider]  latanoprost (XALATAN) 0.005 % ophthalmic solution Place 1 drop into both eyes at bedtime. Patient not taking: Reported on 01/24/2020    [provider]  losartan (COZAAR) 25 MG tablet Take 1 tablet by mouth daily. 12/14/18   [provider]  meclizine (ANTIVERT) 25 MG tablet Take 1 tablet (25 mg total) by mouth 3 (three) times daily as needed for dizziness. 08/01/21   Kommor, Madison, MD  metFORMIN (GLUCOPHAGE) 1000 MG tablet Take 1,000 mg by mouth 2 (two) times daily with a meal. 06/23/18   [provider]  Multiple Vitamins-Minerals (CENTRUM SILVER PO) Take 1 tablet by mouth daily.    [provider]  nystatin cream (MYCOSTATIN) APPLY CREAM TOPICALLY TO AFFECTED AREA TWICE DAILY (When needed) Patient not taking: Reported on 01/24/2020 03/20/18   [provider]  ondansetron (ZOFRAN) 4 MG tablet Take 1 tablet (4 mg total) by mouth every 8 (eight) hours as needed for nausea or vomiting. 10/13/21   Tegeler, Gwenyth Allegra, MD  pravastatin (PRAVACHOL) 20 MG tablet Take 20 mg by mouth daily. 09/13/18   [provider]  silodosin  (RAPAFLO) 8 MG CAPS capsule TAKE 1 CAPSULE BY MOUTH ONCE DAILY WITH BREAKFAST 10/10/18   [provider]  triamcinolone cream (KENALOG) 0.1 % APPLY A THIN LAYER TOPICALLY TO AFFECTED AREA TWICE DAILY Patient not taking: Reported on 01/24/2020 03/20/18   [provider]      Allergies    Patient has no known allergies.    Review of Systems   Review of Systems  Constitutional:  Negative for fever.  HENT:  Negative for hearing loss.   Eyes:  Negative for visual disturbance.  Respiratory:  Negative for shortness of breath.   Cardiovascular:  Negative for chest pain and syncope.  Gastrointestinal:  Negative for blood in stool, diarrhea, nausea and vomiting.  Genitourinary:  Negative for dysuria.  Musculoskeletal:  Negative for neck pain.  Skin:  Negative for rash.  Neurological:  Positive for dizziness and headaches.    Physical Exam Updated Vital Signs BP (!) 146/89 (BP Location: Left Arm)   Pulse 70   Temp 98.7 F (37.1 C) (Oral)   Resp 20   Ht _0  (1.702 m)   Wt 79.4 kg   SpO2 97%   BMI 27.41 kg/m  Physical Exam Vitals and nursing note reviewed.  Constitutional:      General: He is not in acute distress.    Appearance: Normal appearance. He is well-developed.  HENT:     Head: Normocephalic and atraumatic.  Eyes:     Conjunctiva/sclera: Conjunctivae normal.  Cardiovascular:     Rate and Rhythm: Normal rate and regular rhythm.     Heart sounds: No murmur heard. Pulmonary:     Effort: Pulmonary effort is normal. No respiratory distress.     Breath sounds: Normal breath sounds.  Abdominal:     Palpations: Abdomen is soft.     Tenderness: There is no abdominal tenderness. There is no guarding or rebound.  Musculoskeletal:        General: No swelling.     Cervical back: Neck supple.  Skin:    General: Skin is warm and dry.     Capillary Refill: Capillary refill takes less than 2 seconds.  Neurological:     Mental Status: He is alert.     Sensory:  Sensory deficit present.     Motor: Weakness present.     Comments: He feels subjective decrease sensation on the right side of his face.  He has normal upper extremity strength.  He has some weakness on flexing at his left hip although it is unclear if its pain that is limiting him.  He says it stiff.  Distal strength intact     ED Results / Procedures / Treatments   Labs (all labs ordered are listed, but only abnormal results are displayed) Labs Reviewed  BASIC METABOLIC PANEL - Abnormal; Notable for the following components:      Result Value   Glucose, Bld 169 (*)  All other components within normal limits  CBC - Abnormal; Notable for the following components:   RBC 4.16 (*)    Hemoglobin 12.4 (*)    HCT 36.2 (*)    Platelets 130 (*)    All other components within normal limits  URINALYSIS, ROUTINE W REFLEX MICROSCOPIC - Abnormal; Notable for the following components:   Protein, ur 100 (*)    All other components within normal limits  CBG MONITORING, ED - Abnormal; Notable for the following components:   Glucose-Capillary 165 (*)    All other components within normal limits  URINALYSIS, MICROSCOPIC (REFLEX)    EKG EKG Interpretation  Date/Time:  Thursday November 19 2021 15:26:37 EDT Ventricular Rate:  69 PR Interval:  160 QRS Duration: 90 QT Interval:  384 QTC Calculation: 411 R Axis:   95 Text Interpretation: Normal sinus rhythm Rightward axis Borderline ECG When compared with ECG of 13-Oct-2021 21:22, No significant change since last tracing Confirmed by Aletta Edouard [phone]) on 11/19/2021 3:32:04 PM  Radiology MR BRAIN WO CONTRAST  Result Date: 11/19/2021 CLINICAL DATA:  Dizziness EXAM: MRI HEAD WITHOUT CONTRAST TECHNIQUE: Multiplanar, multiecho pulse sequences of the brain and surrounding structures were obtained without intravenous contrast. COMPARISON:  01/12/2020 FINDINGS: Brain: No acute infarct, mass effect or extra-axial collection. No acute or chronic  hemorrhage. There is multifocal hyperintense T2-weighted signal within the white matter. Generalized volume loss. The midline structures are normal. Vascular: Major flow voids are preserved. Skull and upper cervical spine: Normal calvarium and skull base. Visualized upper cervical spine and soft tissues are normal. Sinuses/Orbits:No paranasal sinus fluid levels or advanced mucosal thickening. No mastoid or middle ear effusion. Normal orbits. IMPRESSION: 1. No acute intracranial abnormality. 2. Findings of chronic small vessel ischemia and volume loss. Electronically Signed   By: Ulyses Jarred M.D.   On: 11/19/2021 22:52   CT ANGIO HEAD NECK W WO CM  Result Date: 11/19/2021 CLINICAL DATA:  Dizziness. EXAM: CT ANGIOGRAPHY HEAD AND NECK TECHNIQUE: Multidetector CT imaging of the head and neck was performed using the standard protocol during bolus administration of intravenous contrast. Multiplanar CT image reconstructions and MIPs were obtained to evaluate the vascular anatomy. Carotid stenosis measurements (when applicable) are obtained utilizing NASCET criteria, using the distal internal carotid diameter as the denominator. RADIATION DOSE REDUCTION: This exam was performed according to the departmental dose-optimization program which includes automated exposure control, adjustment of the mA and/or kV according to patient size and/or use of iterative reconstruction technique. CONTRAST:  74m OMNIPAQUE IOHEXOL 350 MG/ML SOLN COMPARISON:  Head CT 08/01/2021. Head MRI 01/12/2020. Head MRA 06/07/2015. Chest CT 12/26/2016. FINDINGS: CT HEAD FINDINGS Brain: There is no evidence of an acute infarct, intracranial hemorrhage, mass, midline shift, or extra-axial fluid collection. Mild cerebral atrophy is within normal limits for age. Hypodensities in the cerebral white matter bilaterally are unchanged and nonspecific but compatible with moderate chronic small vessel ischemic disease. There is an unchanged chronic lacunar  infarct in the right thalamus. Vascular: Calcified atherosclerosis at the skull base. Skull: No fracture or suspicious osseous lesion. Sinuses: Minimal mucosal thickening in the right maxillary sinus. Clear mastoid air cells. Orbits: Unremarkable. Review of the MIP images confirms the above findings CTA NECK FINDINGS Aortic arch: Normal variant aortic arch branching pattern with common origin of the brachiocephalic and left common carotid arteries. Wide patency of the brachiocephalic and subclavian arteries. Right carotid system: Patent without evidence of stenosis, dissection, or significant atherosclerosis. Left carotid system: Patent without evidence of stenosis,  dissection, or significant atherosclerosis. Vertebral arteries: Patent and codominant without evidence of stenosis, dissection, or significant atherosclerosis although right V1 assessment is limited by venous contrast. Skeleton: Advanced lower cervical disc degeneration and cervical and upper thoracic facet arthrosis. Other neck: No evidence of cervical lymphadenopathy or mass. Upper chest: No apical lung consolidation or mass. Mild subpleural reticulation/fibrosis bilaterally. Mildly enlarged mediastinal lymph nodes including a 1.8 cm short axis lower right paratracheal lymph node which is unchanged from 2018 and considered benign, possibly related to chronic lung disease. Review of the MIP images confirms the above findings CTA HEAD FINDINGS Anterior circulation: The internal carotid arteries are patent from skull base to carotid termini with calcified plaque resulting in mild proximal supraclinoid stenosis on the right. ACAs and MCAs are patent without evidence of a proximal branch occlusion or significant A1 or left M1 stenosis. A severe distal right M1 stenosis is new from 2017. No aneurysm is identified. Posterior circulation: The intracranial vertebral arteries are widely patent to the basilar. Patent PICA, AICA, and SCA origins are seen  bilaterally. The basilar artery is widely patent. There is a small left posterior communicating artery. Both PCAs are patent without evidence of a significant proximal stenosis. No aneurysm is identified. Venous sinuses: Patent. Anatomic variants: None. Review of the MIP images confirms the above findings IMPRESSION: 1. No evidence of acute intracranial abnormality. Moderate chronic small vessel ischemic disease. 2. Intracranial atherosclerosis including a severe distal right M1 stenosis which is new from 2017. 3. Widely patent cervical carotid and vertebral arteries. Electronically Signed   By: Logan Bores M.D.   On: 11/19/2021 17:04    Procedures Procedures    Medications Ordered in ED Medications  sodium chloride 0.9 % bolus 1,000 mL (0 mLs Intravenous Stopped 11/19/21 1754)  iohexol (OMNIPAQUE) 350 MG/ML injection 75 mL (75 mLs Intravenous Contrast Given 11/19/21 1630)    ED Course/ Medical Decision Making/ A&P Clinical Course as of 11/20/21 0938  Thu Nov 19, 2021  1604 Glucose(!): 169 [MB]  1825 Reviewed results of imaging with patient and family.  He ambulated after fluids and still feels unsteady on his feet.  Will need MRI as otherwise work-up has been fairly unremarkable.  Patient is agreeable to plan for transfer via private vehicle over to [facility].  MRI ordered.  If he is positive for stroke will need admission and if negative continue symptomatic treatment at home with meclizine fluids and rest. [MB]  1834 Patient accepted by Dr. Gilford Raid in transfer to get MRI. [MB]  2319 Pt evaluated and notified of findings from MRI. Discussed discharge treatment plan with patient. Answered all available questions. Pt appears safe for discharge at this time.  [SB]    Clinical Course User Index [MB] Hayden Rasmussen, MD [SB] Blue, Soijett A, PA-C                           Medical Decision Making Amount and/or Complexity of Data Reviewed Labs: ordered. Decision-making details documented in ED  Course. Radiology: ordered.  Risk Prescription drug management.  This patient complains of dizziness and unsteady gait; this involves an extensive number of treatment Options and is a complaint that carries with it a high risk of complications and morbidity. The differential includes stroke, bleed, vertigo, dehydration, metabolic derangement  I ordered, reviewed and interpreted labs, which included CBC with normal white count, hemoglobin low stable from priors, chemistries normal other than elevated glucose, urinalysis without signs of  infection IV fluids I ordered medication IV fluids and reviewed PMP when indicated. I ordered imaging studies which included CT angio head and neck, MRI and I independently    visualized and interpreted imaging which showed no acute findings.  Patient will be transferred for MRI as not available on this campus Additional history obtained from patient's family members Previous records obtained and reviewed in epic including prior admission for dizziness  Cardiac monitoring reviewed, normal sinus rhythm Social determinants considered, no significant barriers Critical Interventions: None  After the interventions stated above, I reevaluated the patient and found patient to be improved although symptoms not resolved. Admission and further testing considered, patient needs MRI for further work-up of his dizziness to exclude stroke.  He wishes to proceed with private transport over to [facility].  Discussed with Dr. Gilford Raid ED physician who accepts in transfer.  Return instructions discussed.          Final Clinical Impression(s) / ED Diagnoses Final diagnoses:  Dizziness  Unsteady gait when walking    Rx / DC Orders ED Discharge Orders     None         Hayden Rasmussen, MD 11/20/21 [id number]

## 2021-11-19 NOTE — ED Triage Notes (Addendum)
Lightheaded, dizzy, off balance, nausea, and decreased PO intake since Monday. Upon assessment, pt has decreased sensation in right side of face. No other neuro deficits. Pt a&o x 4, ambulatory with steady gait. HX of stroke in 2013.

## 2021-11-19 NOTE — ED Notes (Signed)
Report called to charge RN at [facility] ED to notify of transfer.

## 2021-11-19 NOTE — Discharge Instructions (Signed)
It was a pleasure taking care of you today!   Your labs and imaging were unremarkable today. You will be sent a refill of your meclizine, take as directed. It is important that you call your primary care provider tomorrow to set up a follow up appointment regarding todays ED visit. Return to the ED if you are experiencing increasing/worsening dizziness, unsteady walking, vision changes, or worsening symptoms.

## 2021-11-19 NOTE — ED Notes (Signed)
Ambulated pt in the hallway, still feels dizzy and swaying side to side, but feels like its improved. No gait issues, no other problems.

## 2022-06-17 ENCOUNTER — Emergency Department (HOSPITAL_BASED_OUTPATIENT_CLINIC_OR_DEPARTMENT_OTHER)
Admission: EM | Admit: 2022-06-17 | Discharge: 2022-06-17 | Disposition: A | Discharge status: Home / Self Care | Payer: PPO | Attending: Emergency Medicine | Admitting: Emergency Medicine

## 2022-06-17 ENCOUNTER — Emergency Department (HOSPITAL_BASED_OUTPATIENT_CLINIC_OR_DEPARTMENT_OTHER): Payer: PPO

## 2022-06-17 ENCOUNTER — Encounter (HOSPITAL_BASED_OUTPATIENT_CLINIC_OR_DEPARTMENT_OTHER): Payer: Self-pay

## 2022-06-17 ENCOUNTER — Other Ambulatory Visit: Payer: Self-pay

## 2022-06-17 DIAGNOSIS — R058 Other specified cough: Secondary | ICD-10-CM | POA: Diagnosis not present

## 2022-06-17 DIAGNOSIS — I10 Essential (primary) hypertension: Secondary | ICD-10-CM | POA: Insufficient documentation

## 2022-06-17 DIAGNOSIS — R059 Cough, unspecified: Secondary | ICD-10-CM | POA: Insufficient documentation

## 2022-06-17 DIAGNOSIS — Z8673 Personal history of transient ischemic attack (TIA), and cerebral infarction without residual deficits: Secondary | ICD-10-CM | POA: Insufficient documentation

## 2022-06-17 DIAGNOSIS — Z1152 Encounter for screening for COVID-19: Secondary | ICD-10-CM | POA: Diagnosis not present

## 2022-06-17 DIAGNOSIS — Z7984 Long term (current) use of oral hypoglycemic drugs: Secondary | ICD-10-CM | POA: Diagnosis not present

## 2022-06-17 DIAGNOSIS — R531 Weakness: Secondary | ICD-10-CM | POA: Insufficient documentation

## 2022-06-17 DIAGNOSIS — Z7982 Long term (current) use of aspirin: Secondary | ICD-10-CM | POA: Diagnosis not present

## 2022-06-17 DIAGNOSIS — Z79899 Other long term (current) drug therapy: Secondary | ICD-10-CM | POA: Insufficient documentation

## 2022-06-17 DIAGNOSIS — R0789 Other chest pain: Secondary | ICD-10-CM | POA: Insufficient documentation

## 2022-06-17 DIAGNOSIS — Z794 Long term (current) use of insulin: Secondary | ICD-10-CM | POA: Insufficient documentation

## 2022-06-17 DIAGNOSIS — E1165 Type 2 diabetes mellitus with hyperglycemia: Secondary | ICD-10-CM | POA: Diagnosis not present

## 2022-06-17 LAB — CBC
HCT: 39.7 % (ref 39.0–52.0)
Hemoglobin: 13 g/dL (ref 13.0–17.0)
MCH: 29.5 pg (ref 26.0–34.0)
MCHC: 32.7 g/dL (ref 30.0–36.0)
MCV: 90.2 fL (ref 80.0–100.0)
Platelets: 128 10*3/uL — ABNORMAL LOW (ref 150–400)
RBC: 4.4 MIL/uL (ref 4.22–5.81)
RDW: 12.8 % (ref 11.5–15.5)
WBC: 7.5 10*3/uL (ref 4.0–10.5)
nRBC: 0 % (ref 0.0–0.2)

## 2022-06-17 LAB — BASIC METABOLIC PANEL
Anion gap: 7 (ref 5–15)
BUN: 17 mg/dL (ref 8–23)
CO2: 26 mmol/L (ref 22–32)
Calcium: 9.1 mg/dL (ref 8.9–10.3)
Chloride: 101 mmol/L (ref 98–111)
Creatinine, Ser: 1.11 mg/dL (ref 0.61–1.24)
GFR, Estimated: >60 mL/min (ref >60)
Glucose, Bld: 272 mg/dL — ABNORMAL HIGH (ref 70–99)
Potassium: 4.3 mmol/L (ref 3.5–5.1)
Sodium: 134 mmol/L — ABNORMAL LOW (ref 135–145)

## 2022-06-17 LAB — TROPONIN I (HIGH SENSITIVITY)
Troponin I (High Sensitivity): 5 ng/L (ref <18)
Troponin I (High Sensitivity): 7 ng/L (ref <18)

## 2022-06-17 LAB — URINALYSIS, MICROSCOPIC (REFLEX)

## 2022-06-17 LAB — RESP PANEL BY RT-PCR (RSV, FLU A&B, COVID)  RVPGX2
Influenza A by PCR: NEGATIVE
Influenza B by PCR: NEGATIVE
Resp Syncytial Virus by PCR: NEGATIVE
SARS Coronavirus 2 by RT PCR: NEGATIVE

## 2022-06-17 LAB — CBG MONITORING, ED: Glucose-Capillary: 265 mg/dL — ABNORMAL HIGH (ref 70–99)

## 2022-06-17 LAB — URINALYSIS, ROUTINE W REFLEX MICROSCOPIC
Bilirubin Urine: NEGATIVE
Glucose, UA: >=500 mg/dL — AB
Hgb urine dipstick: NEGATIVE
Ketones, ur: NEGATIVE mg/dL
Leukocytes,Ua: NEGATIVE
Nitrite: NEGATIVE
Protein, ur: >=300 mg/dL — AB
Specific Gravity, Urine: 1.025 (ref 1.005–1.030)
pH: 6 (ref 5.0–8.0)

## 2022-06-17 LAB — MAGNESIUM: Magnesium: 1.5 mg/dL — ABNORMAL LOW (ref 1.7–2.4)

## 2022-06-17 LAB — TSH: TSH: 1.289 u[IU]/mL (ref 0.350–4.500)

## 2022-06-17 MED ORDER — SODIUM CHLORIDE 0.9 % IV BOLUS
1000.0000 mL | Freq: Once | INTRAVENOUS | Status: AC
  Administered 2022-06-17: 1000 mL via INTRAVENOUS

## 2022-06-17 MED ORDER — MAGNESIUM SULFATE 2 GM/50ML IV SOLN
2.0000 g | Freq: Once | INTRAVENOUS | Status: AC
  Administered 2022-06-17: 2 g via INTRAVENOUS
  Filled 2022-06-17: qty 50

## 2022-06-17 NOTE — ED Notes (Signed)
Lauren, PA at bedside.

## 2022-06-17 NOTE — ED Triage Notes (Signed)
States he "feels dehydrated", c/o generalized weakness since yesterday. Denies urinary symptoms. States slight cough.

## 2022-06-17 NOTE — ED Notes (Signed)
Pt wheeled out of ED via wheelchair. Pt and wife verbalized understanding of d/c instructions and follow up care.

## 2022-06-17 NOTE — Discharge Instructions (Signed)
You were seen in the emergency department today for weakness.  We gave you some IV fluids and magnesium and this seemed to improve some your symptoms.  Your labs are stable, you do not have COVID, flu or RSV or pneumonia or UTI.  Please continue to eat and drink plenty of fluids at home.  Please return to the emergency department for worsening symptoms.

## 2022-06-17 NOTE — ED Provider Notes (Signed)
[facility]GENC[facility]PART[facility] AT [facility] Provider Note   CSN: [id number] Arrival date & time: 06/17/22  1244     History  Chief Complaint  Patient presents with   Weakness    Aaron Blevins is a 77 y.o. male. With past medical history of diabetes, hypertension, stroke who presents to the emergency department with generalized weakness.  Patient states that he has been feeling generally unwell since Sunday. States that he has had dry mouth and feels dehydrated. He told his wife this who provided him with Pedialyte and Gatorade but he continued to be symptomatic at home. He states that he has had sore throat, mild non productive cough and nausea without vomiting. Endorses mild left sided, intermittent, sharp chest pain that is non-radiating. He denies having shortness of breath, palpitations, lightheadedness, dizziness. Denies fevers, diarrhea, abdominal pain, dysuria. Denies sick contacts.   Weakness Associated symptoms: cough        Home Medications Prior to Admission medications   Medication Sig Start Date End Date Taking? Authorizing Provider  aspirin 81 MG tablet Take 81 mg by mouth daily.    [provider]  benzonatate (TESSALON) 100 MG capsule Take 1 capsule (100 mg total) by mouth every 8 (eight) hours. 10/13/21   Tegeler, Gwenyth Allegra, MD  Blood Glucose Monitoring Suppl (ONE TOUCH ULTRA 2) w/Device KIT USE TWICE DAILY TO TEST BLOOD SUGAR 07/13/18   [provider]  diclofenac Sodium (VOLTAREN) 1 % GEL Apply 2 g topically 4 (four) times daily as needed. 01/21/21   Long, Wonda Olds, MD  docusate sodium (COLACE) 250 MG capsule Take 1 capsule (250 mg total) by mouth daily as needed for constipation. 01/21/21   Long, Wonda Olds, MD  famotidine (PEPCID) 40 MG tablet Take 40 mg by mouth daily. Patient not taking: Reported on 01/24/2020 09/13/18   [provider]  fluticasone (FLONASE) 50 MCG/ACT nasal spray Place 1 spray into both nostrils  daily. Patient not taking: Reported on 01/24/2020    [provider]  insulin glargine (LANTUS) 100 UNIT/ML injection Inject 20 Units into the skin at bedtime.     [provider]  ketoconazole (NIZORAL) 2 % cream Apply 1 application topically daily. Patient not taking: Reported on 01/24/2020    [provider]  latanoprost (XALATAN) 0.005 % ophthalmic solution Place 1 drop into both eyes at bedtime. Patient not taking: Reported on 01/24/2020    [provider]  losartan (COZAAR) 25 MG tablet Take 1 tablet by mouth daily. 12/14/18   [provider]  meclizine (ANTIVERT) 25 MG tablet Take 1 tablet (25 mg total) by mouth 3 (three) times daily as needed for dizziness. 11/19/21   Blue, Soijett A, PA-C  metFORMIN (GLUCOPHAGE) 1000 MG tablet Take 1,000 mg by mouth 2 (two) times daily with a meal. 06/23/18   [provider]  Multiple Vitamins-Minerals (CENTRUM SILVER PO) Take 1 tablet by mouth daily.    [provider]  nystatin cream (MYCOSTATIN) APPLY CREAM TOPICALLY TO AFFECTED AREA TWICE DAILY (When needed) Patient not taking: Reported on 01/24/2020 03/20/18   [provider]  ondansetron (ZOFRAN) 4 MG tablet Take 1 tablet (4 mg total) by mouth every 8 (eight) hours as needed for nausea or vomiting. 10/13/21   Tegeler, Gwenyth Allegra, MD  pravastatin (PRAVACHOL) 20 MG tablet Take 20 mg by mouth daily. 09/13/18   [provider]  silodosin (RAPAFLO) 8 MG CAPS capsule TAKE 1 CAPSULE BY MOUTH ONCE DAILY WITH  BREAKFAST 10/10/18   [provider]  triamcinolone cream (KENALOG) 0.1 % APPLY A THIN LAYER TOPICALLY TO AFFECTED AREA TWICE DAILY Patient not taking: Reported on 01/24/2020 03/20/18   [provider]      Allergies    Patient has no known allergies.    Review of Systems   Review of Systems  Constitutional:  Positive for fatigue.  HENT:  Positive for sore throat.   Respiratory:  Positive for cough.    Neurological:  Positive for weakness.  All other systems reviewed and are negative.   Physical Exam Updated Vital Signs BP (!) 152/95   Pulse 67   Temp 98 F (36.7 C) (Oral)   Resp 15   Ht 5\' 6"  (1.676 m)   Wt 66.7 kg   SpO2 95%   BMI 23.73 kg/m  Physical Exam Vitals and nursing note reviewed.  Constitutional:      General: He is not in acute distress.    Appearance: Normal appearance. He is not ill-appearing or toxic-appearing.  HENT:     Head: Normocephalic.     Nose: Nose normal.     Mouth/Throat:     Mouth: Mucous membranes are moist.     Pharynx: Oropharynx is clear. No posterior oropharyngeal erythema.  Eyes:     General: No scleral icterus.    Extraocular Movements: Extraocular movements intact.     Pupils: Pupils are equal, round, and reactive to light.  Cardiovascular:     Rate and Rhythm: Normal rate and regular rhythm.     Pulses: Normal pulses.     Heart sounds: No murmur heard. Pulmonary:     Effort: Pulmonary effort is normal. No respiratory distress.     Breath sounds: Normal breath sounds. No wheezing, rhonchi or rales.  Abdominal:     General: Bowel sounds are normal. There is no distension.     Palpations: Abdomen is soft.     Tenderness: There is no abdominal tenderness.  Musculoskeletal:        General: Normal range of motion.     Cervical back: Neck supple.  Skin:    General: Skin is warm and dry.     Capillary Refill: Capillary refill takes less than 2 seconds.  Neurological:     General: No focal deficit present.     Mental Status: He is alert and oriented to person, place, and time. Mental status is at baseline.  Psychiatric:        Mood and Affect: Mood normal.        Behavior: Behavior normal.        Thought Content: Thought content normal.        Judgment: Judgment normal.     ED Results / Procedures / Treatments   Labs (all labs ordered are listed, but only abnormal results are displayed) Labs Reviewed  BASIC METABOLIC PANEL  - Abnormal; Notable for the following components:      Result Value   Sodium 134 (*)    Glucose, Bld 272 (*)    All other components within normal limits  CBC - Abnormal; Notable for the following components:   Platelets 128 (*)    All other components within normal limits  URINALYSIS, ROUTINE W REFLEX MICROSCOPIC - Abnormal; Notable for the following components:   Glucose, UA >=500 (*)    Protein, ur >=300 (*)    All other components within normal limits  URINALYSIS, MICROSCOPIC (REFLEX) - Abnormal; Notable for the following components:  Bacteria, UA RARE (*)    All other components within normal limits  MAGNESIUM - Abnormal; Notable for the following components:   Magnesium 1.5 (*)    All other components within normal limits  CBG MONITORING, ED - Abnormal; Notable for the following components:   Glucose-Capillary 265 (*)    All other components within normal limits  RESP PANEL BY RT-PCR (RSV, FLU A&B, COVID)  RVPGX2  TSH  TROPONIN I (HIGH SENSITIVITY)  TROPONIN I (HIGH SENSITIVITY)    EKG EKG Interpretation  Date/Time:  Thursday June 17 2022 13:18:22 EST Ventricular Rate:  87 PR Interval:  168 QRS Duration: 97 QT Interval:  377 QTC Calculation: 454 R Axis:   81 Text Interpretation: Sinus rhythm Multiple premature complexes, vent & supraven Borderline right axis deviation Confirmed by Cindee Lame [phone]) on 06/17/2022 3:46:11 PM  Radiology DG Chest Port 1 View  Result Date: 06/17/2022 CLINICAL DATA:  Weakness EXAM: PORTABLE CHEST 1 VIEW COMPARISON:  10/13/2021 FINDINGS: The heart size and mediastinal contours are within normal limits. Both lungs are clear. The visualized skeletal structures are unremarkable. IMPRESSION: No acute abnormality of the lungs in AP portable projection. Electronically Signed   By: Delanna Ahmadi M.D.   On: 06/17/2022 20:08    Procedures Procedures   Medications Ordered in ED Medications  sodium chloride 0.9 % bolus 1,000 mL (0 mLs  Intravenous Stopped 06/17/22 1837)  magnesium sulfate IVPB 2 g 50 mL (0 g Intravenous Stopped 06/17/22 2029)    ED Course/ Medical Decision Making/ A&P   {                           Medical Decision Making Amount and/or Complexity of Data Reviewed Labs: ordered. Radiology: ordered.  Risk Prescription drug management.  Initial Impression and Ddx 77 year old male who presents for concerns of dehydration and feeling generally weak. Difficult to obtain history as patient responds yes to most questions. Will obtain broader work up to rule out etiology of his weakness.  Patient PMH that increases complexity of ED encounter:  stroke, hypertension, diabetes Differential: infection, dehydration, viral infection, ACS, failure to thrive, DKA, HHS, etc.   Interpretation of Diagnostics I independent reviewed and interpreted the labs as followed: bmp hyperglycemia, cbc plt 128, mag 1.5, troponin negative, TSH wnl, UA negative, resp panel negative  - I independently visualized the following imaging with scope of interpretation limited to determining acute life threatening conditions related to emergency care: chest portable, which revealed no acute findings  Patient Reassessment and Ultimate Disposition/Management 77 year old male who presents to ED with generalized weakness and feels dehydrated.  He is overall well-appearing, nonseptic, non toxic in appearance. Non focal neurological exam. Doubt stroke.  History of diabetes, BG is a little elevated here but no ketonuria, AG, or decreased bicarb. Doubt DKA.  Covid/flu/rsv negative  UA without UTI, CXR without pneumonia EKG without ischemia or infarction, troponin negative, doubt ACS. TSH wnl  Mag is low at 1.5, replaced IV here Also gave him 1L IVF bolus. No history of heart failure. Skin is mildly dry and he is complaining of dry mouth.   Unclear etiology of his generalized weakness. He may have been dehydrated although his labs do not reflect  this. Clinically, does not appear significantly hypovolemic.  On reassessment he is feeling much improved. Wife at bedside feels like he is a bit more responsive, sitting on the side of the bed and ready to go home. Discussed  return precautions for worsening symptoms. They verbalize understanding. F/u with PCP.  The patient has been appropriately medically screened and/or stabilized in the ED. I have low suspicion for any other emergent medical condition which would require further screening, evaluation or treatment in the ED or require inpatient management. At time of discharge the patient is hemodynamically stable and in no acute distress. I have discussed work-up results and diagnosis with patient and answered all questions. Patient is agreeable with discharge plan. We discussed strict return precautions for returning to the emergency department and they verbalized understanding.     Patient management required discussion with the following services or consulting groups:  None  Complexity of Problems Addressed Acute complicated illness or Injury  Additional Data Reviewed and Analyzed Further history obtained from: Past medical history and medications listed in the EMR, Prior ED visit notes, Care Everywhere, and Prior labs/imaging results  Patient Encounter Risk Assessment SDOH impact on management and Consideration of hospitalization  Final Clinical Impression(s) / ED Diagnoses Final diagnoses:  Weakness    Rx / DC Orders ED Discharge Orders     None         Mickie Hillier, PA-C 06/17/22 2346    Fredia Sorrow, MD 06/19/22 2006

## 2022-06-21 ENCOUNTER — Ambulatory Visit: Payer: Self-pay

## 2022-06-21 NOTE — Telephone Encounter (Signed)
  Chief Complaint: Medication question Symptoms:  Frequency:  Pertinent Negatives: Patient denies  Disposition: [] ED /[] Urgent Care (no appt availability in office) / [] Appointment(In office/virtual)/ []  [facility]/ [] Home Care/ [] Refused Recommended Disposition /[] [facility]/ [x]  Follow-up with PCP Additional Notes: Spoke with pt's wife. Permission given by pt. Pt was seen at ED. At ED visit pt was found to have low magnesium level. Pt wanted to amount of magnesium pt should take. Reviewed ED notes and did not find recommenced dose for supplementation. PT's wife will cal PCP.    Summary: Med questions for nurse   Pt's wife called for medication advice regarding recommendations from ED for the patient  Best contact: [phone]     Reason for Disposition  [1] Caller requesting NON-URGENT health information AND [2] PCP's office is the best resource  Answer Assessment - Initial Assessment Questions 1. REASON FOR CALL or QUESTION: "What is your reason for calling today?" or "How can I best help you?" or "What question do you have that I can help answer?"     How much magnesium should pt be taking?  Protocols used: Information Only Call - No Triage-A-AH
